# Patient Record
Sex: Male | Born: 1975 | Race: White | Hispanic: No | Marital: Single | Smoking: Former smoker
Health system: Southern US, Community
[De-identification: ages and names within clinical notes are randomized; demographics above are authoritative.]

## PROBLEM LIST (undated history)

## (undated) DIAGNOSIS — E119 Type 2 diabetes mellitus without complications: Secondary | ICD-10-CM

## (undated) DIAGNOSIS — F32A Depression, unspecified: Secondary | ICD-10-CM

## (undated) DIAGNOSIS — F329 Major depressive disorder, single episode, unspecified: Secondary | ICD-10-CM

## (undated) DIAGNOSIS — I10 Essential (primary) hypertension: Secondary | ICD-10-CM

## (undated) DIAGNOSIS — G4730 Sleep apnea, unspecified: Secondary | ICD-10-CM

## (undated) DIAGNOSIS — F419 Anxiety disorder, unspecified: Secondary | ICD-10-CM

## (undated) HISTORY — DX: Major depressive disorder, single episode, unspecified: F32.9

## (undated) HISTORY — DX: Depression, unspecified: F32.A

## (undated) HISTORY — DX: Type 2 diabetes mellitus without complications: E11.9

## (undated) HISTORY — DX: Anxiety disorder, unspecified: F41.9

## (undated) HISTORY — PX: APPENDECTOMY: SHX54

## (undated) HISTORY — PX: HERNIA REPAIR: SHX51

## (undated) HISTORY — PX: BACK SURGERY: SHX140

---

## 2014-12-02 ENCOUNTER — Ambulatory Visit (INDEPENDENT_AMBULATORY_CARE_PROVIDER_SITE_OTHER): Payer: Worker's Compensation | Admitting: Family Medicine

## 2014-12-02 VITALS — BP 132/66 | HR 79 | Temp 98.1°F | Resp 18 | Ht 72.0 in | Wt 357.0 lb

## 2014-12-02 DIAGNOSIS — M545 Low back pain: Secondary | ICD-10-CM

## 2014-12-20 NOTE — Progress Notes (Signed)
Subjective:  This chart was scribe for Norberto Sorenson, MD by Angelene Giovanni, ED Scribe. The patient was seen in Room 10 and the patient's care was started at 3:07 PM.    Patient ID: Jason Polite., male    DOB: 01-12-1976, 39 y.o.   MRN: [id number] Chief Complaint  Patient presents with  . Follow-up    back injury    HPI HPI Comments: Jason Levy. is a 39 y.o. male with hx of DM who presents to the [facility] for a follow up from his workers comp back injury. He reports that the pain is worse on the right that radiates mid, up. He reports that his toe numbness is improving. He states that PT is going well but he has goals that are not reached so he is here for an extension. He reports that he is still taking 2-3 Robaxin daily at night. He states that he has one refill left. He reports that he started a new job where he is rarely lifting and driving but overall less demanding than his previous job.   Lumbar spine xray normal Pt was last seen 2/22 when he had just started PT. He continued to healed well and symptoms have regressed since that time. Initially, he was put on Norco, Robaxin and Naprison. He currently uses Robaxin and Nabumetone prescribed by his PCP.   Past Medical History  Diagnosis Date  . Diabetes mellitus without complication   . Depression   . Anxiety     No current outpatient prescriptions on file prior to visit.   No current facility-administered medications on file prior to visit.   Allergies  Allergen Reactions  . Amoxil [Amoxicillin]   . Penicillins       Review of Systems  Constitutional: Negative for fever, chills, diaphoresis, activity change, appetite change, fatigue and unexpected weight change.  Respiratory: Negative for cough, chest tightness and shortness of breath.   Cardiovascular: Negative for chest pain.  Gastrointestinal: Negative for vomiting, abdominal pain and diarrhea.  Musculoskeletal: Positive for back pain.  Negative for joint swelling, gait problem, neck pain and neck stiffness.  Neurological: Negative for dizziness, light-headedness and headaches.       Objective:   Physical Exam  Constitutional: He is oriented to person, place, and time. He appears well-developed and well-nourished. No distress.  HENT:  Head: Normocephalic and atraumatic.  Eyes: Conjunctivae and EOM are normal.  Neck: Neck supple. No tracheal deviation present.  Cardiovascular: Normal rate.   Pulmonary/Chest: Effort normal. No respiratory distress.  Musculoskeletal: Normal range of motion.  No Tenderness over lumbar spine and left paraspinal but mild over right lumbar paraspinal.  2+ patella DTR Negative straight leg raise Calf muscles are nice and strong Flexion, extension mild limited Normal lateral flexion.   Neurological: He is alert and oriented to person, place, and time.  Skin: Skin is warm and dry.  Psychiatric: He has a normal mood and affect. His behavior is normal.  Nursing note and vitals reviewed.   BP 132/66 mmHg  Pulse 79  Temp(Src) 98.1 F (36.7 C) (Oral)  Resp 18  Ht 6' (1.829 m)  Wt 357 lb (161.934 kg)  BMI 48.41 kg/m2  SpO2 97%       Assessment & Plan:   Low back pain without sciatica, unspecified back pain laterality  Gradually impoving but recommend extending PT sessions, prn nsaids, light duty - recheck in 2-4 wks.   Meds ordered this encounter  Medications  . DISCONTD: HYDROcodone-acetaminophen (NORCO/VICODIN) 5-325 MG per tablet    Sig: Take 1 tablet by mouth every 6 (six) hours as needed for moderate pain.  . BuPROPion HCl (WELLBUTRIN PO)    Sig: Take by mouth 2 (two) times daily.  . Levothyroxine Sodium (SYNTHROID PO)    Sig: Take by mouth.  . LISINOPRIL PO    Sig: Take by [place].  . METFORMIN HCL PO    Sig: Take by mouth 2 (two) times daily.  [place]. GLIMEPIRIDE PO    Sig: Take by mouth.  . methocarbamol (ROBAXIN) 500 MG tablet    Sig: Take 500 mg by mouth 2 (two) times  daily.    I personally performed the services described in this documentation, which was scribed in my presence. The recorded information has been reviewed and considered, and addended by me as needed.  Norberto SorensonEva Kamsiyochukwu Buist, MD MPH

## 2014-12-22 ENCOUNTER — Telehealth: Payer: Self-pay

## 2014-12-22 DIAGNOSIS — M545 Low back pain: Secondary | ICD-10-CM

## 2014-12-22 NOTE — Telephone Encounter (Signed)
PT STATES HIS LAST VISIT ON 12/02/14 HE WAS TO GET MORE P.T. VISITS FOR HIS WORKERS COMP INJURY.. THERE IS NOT AN ORDER IN SYSTEM. SHOULD THIS PATIENT HAVE AN ORDER FOR ADDITIONAL P.T. VISITS?

## 2014-12-22 NOTE — Telephone Encounter (Signed)
Yes, he was already in PT and he needs to continue it at the same location through workers comp until they feel he has achieved maximal improvement thanks

## 2014-12-22 NOTE — Telephone Encounter (Signed)
Please advise on PT visits.

## 2014-12-24 NOTE — Telephone Encounter (Signed)
[facility] [place]. Referral sent. Pt notified.

## 2015-03-02 ENCOUNTER — Ambulatory Visit (INDEPENDENT_AMBULATORY_CARE_PROVIDER_SITE_OTHER): Payer: Worker's Compensation | Admitting: Family Medicine

## 2015-03-02 VITALS — BP 114/78 | HR 82 | Temp 98.4°F | Resp 16 | Ht 72.0 in | Wt 337.6 lb

## 2015-03-02 DIAGNOSIS — M5442 Lumbago with sciatica, left side: Secondary | ICD-10-CM | POA: Diagnosis not present

## 2015-03-02 DIAGNOSIS — R208 Other disturbances of skin sensation: Secondary | ICD-10-CM | POA: Diagnosis not present

## 2015-03-02 DIAGNOSIS — R200 Anesthesia of skin: Secondary | ICD-10-CM

## 2015-03-02 NOTE — Patient Instructions (Addendum)
Referral is being made to a neurologist for evaluation of numbness of the fourth and fifth toes under Worker's Compensation  No return here as necessary as there is nothing more that I can offer for this, and less the neurologist or Worker's Comp. requires you to return here for another visit.  Allowed to return to work with no restrictions
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
## 2016-08-25 DIAGNOSIS — G834 Cauda equina syndrome: Secondary | ICD-10-CM | POA: Insufficient documentation

## 2019-10-17 DIAGNOSIS — F17210 Nicotine dependence, cigarettes, uncomplicated: Secondary | ICD-10-CM | POA: Insufficient documentation

## 2019-10-17 DIAGNOSIS — Z794 Long term (current) use of insulin: Secondary | ICD-10-CM | POA: Insufficient documentation

## 2019-10-17 DIAGNOSIS — Z888 Allergy status to other drugs, medicaments and biological substances status: Secondary | ICD-10-CM | POA: Diagnosis not present

## 2019-10-17 DIAGNOSIS — E119 Type 2 diabetes mellitus without complications: Secondary | ICD-10-CM | POA: Diagnosis not present

## 2019-10-17 DIAGNOSIS — Z881 Allergy status to other antibiotic agents status: Secondary | ICD-10-CM | POA: Diagnosis not present

## 2019-10-17 DIAGNOSIS — F12188 Cannabis abuse with other cannabis-induced disorder: Secondary | ICD-10-CM | POA: Insufficient documentation

## 2019-10-17 DIAGNOSIS — Z880 Allergy status to penicillin: Secondary | ICD-10-CM | POA: Insufficient documentation

## 2019-10-17 DIAGNOSIS — N3000 Acute cystitis without hematuria: Secondary | ICD-10-CM | POA: Insufficient documentation

## 2019-10-17 DIAGNOSIS — Z79899 Other long term (current) drug therapy: Secondary | ICD-10-CM | POA: Insufficient documentation

## 2019-10-17 DIAGNOSIS — R1110 Vomiting, unspecified: Secondary | ICD-10-CM | POA: Diagnosis present

## 2019-10-17 NOTE — ED Triage Notes (Signed)
Pt reports nausea and vomiting for two days. Having the same issues since November. States burps that smell like sulfer.

## 2019-10-18 ENCOUNTER — Emergency Department (HOSPITAL_BASED_OUTPATIENT_CLINIC_OR_DEPARTMENT_OTHER)
Admission: EM | Admit: 2019-10-18 | Discharge: 2019-10-18 | Disposition: A | Discharge status: Home / Self Care | Payer: 59 | Attending: Emergency Medicine | Admitting: Emergency Medicine

## 2019-10-18 ENCOUNTER — Emergency Department (HOSPITAL_BASED_OUTPATIENT_CLINIC_OR_DEPARTMENT_OTHER): Payer: 59

## 2019-10-18 ENCOUNTER — Other Ambulatory Visit: Payer: Self-pay

## 2019-10-18 ENCOUNTER — Encounter (HOSPITAL_BASED_OUTPATIENT_CLINIC_OR_DEPARTMENT_OTHER): Payer: Self-pay | Admitting: Emergency Medicine

## 2019-10-18 DIAGNOSIS — F12188 Cannabis abuse with other cannabis-induced disorder: Secondary | ICD-10-CM

## 2019-10-18 DIAGNOSIS — N3000 Acute cystitis without hematuria: Secondary | ICD-10-CM

## 2019-10-18 LAB — COMPREHENSIVE METABOLIC PANEL
ALT: 34 U/L (ref 0–44)
AST: 30 U/L (ref 15–41)
Albumin: 4.6 g/dL (ref 3.5–5.0)
Alkaline Phosphatase: 62 U/L (ref 38–126)
Anion gap: 18 — ABNORMAL HIGH (ref 5–15)
BUN: 21 mg/dL — ABNORMAL HIGH (ref 6–20)
CO2: 18 mmol/L — ABNORMAL LOW (ref 22–32)
Calcium: 9.7 mg/dL (ref 8.9–10.3)
Chloride: 99 mmol/L (ref 98–111)
Creatinine, Ser: 1.17 mg/dL (ref 0.61–1.24)
GFR calc Af Amer: >60 mL/min (ref >60)
GFR calc non Af Amer: >60 mL/min (ref >60)
Glucose, Bld: 183 mg/dL — ABNORMAL HIGH (ref 70–99)
Potassium: 3.7 mmol/L (ref 3.5–5.1)
Sodium: 135 mmol/L (ref 135–145)
Total Bilirubin: 0.8 mg/dL (ref 0.3–1.2)
Total Protein: 8.1 g/dL (ref 6.5–8.1)

## 2019-10-18 LAB — URINALYSIS, MICROSCOPIC (REFLEX)

## 2019-10-18 LAB — CBC WITH DIFFERENTIAL/PLATELET
Abs Immature Granulocytes: 0.17 10*3/uL — ABNORMAL HIGH (ref 0.00–0.07)
Basophils Absolute: 0.1 10*3/uL (ref 0.0–0.1)
Basophils Relative: 1 %
Eosinophils Absolute: 0.5 10*3/uL (ref 0.0–0.5)
Eosinophils Relative: 2 %
HCT: 50.1 % (ref 39.0–52.0)
Hemoglobin: 16.1 g/dL (ref 13.0–17.0)
Immature Granulocytes: 1 %
Lymphocytes Relative: 20 %
Lymphs Abs: 4.5 10*3/uL — ABNORMAL HIGH (ref 0.7–4.0)
MCH: 26.4 pg (ref 26.0–34.0)
MCHC: 32.1 g/dL (ref 30.0–36.0)
MCV: 82 fL (ref 80.0–100.0)
Monocytes Absolute: 1.4 10*3/uL — ABNORMAL HIGH (ref 0.1–1.0)
Monocytes Relative: 6 %
Neutro Abs: 15.4 10*3/uL — ABNORMAL HIGH (ref 1.7–7.7)
Neutrophils Relative %: 70 %
Platelets: 501 10*3/uL — ABNORMAL HIGH (ref 150–400)
RBC: 6.11 MIL/uL — ABNORMAL HIGH (ref 4.22–5.81)
RDW: 14.5 % (ref 11.5–15.5)
WBC: 22 10*3/uL — ABNORMAL HIGH (ref 4.0–10.5)
nRBC: 0 % (ref 0.0–0.2)

## 2019-10-18 LAB — BASIC METABOLIC PANEL
Anion gap: 9 (ref 5–15)
BUN: 20 mg/dL (ref 6–20)
CO2: 24 mmol/L (ref 22–32)
Calcium: 9.1 mg/dL (ref 8.9–10.3)
Chloride: 101 mmol/L (ref 98–111)
Creatinine, Ser: 0.86 mg/dL (ref 0.61–1.24)
GFR calc Af Amer: >60 mL/min (ref >60)
GFR calc non Af Amer: >60 mL/min (ref >60)
Glucose, Bld: 137 mg/dL — ABNORMAL HIGH (ref 70–99)
Potassium: 3.8 mmol/L (ref 3.5–5.1)
Sodium: 134 mmol/L — ABNORMAL LOW (ref 135–145)

## 2019-10-18 LAB — RAPID URINE DRUG SCREEN, HOSP PERFORMED
Amphetamines: NOT DETECTED
Barbiturates: NOT DETECTED
Benzodiazepines: NOT DETECTED
Cocaine: NOT DETECTED
Opiates: NOT DETECTED
Tetrahydrocannabinol: POSITIVE — AB

## 2019-10-18 LAB — URINALYSIS, ROUTINE W REFLEX MICROSCOPIC
Glucose, UA: NEGATIVE mg/dL
Hgb urine dipstick: NEGATIVE
Ketones, ur: 15 mg/dL — AB
Leukocytes,Ua: NEGATIVE
Nitrite: NEGATIVE
Protein, ur: 100 mg/dL — AB
Specific Gravity, Urine: >1.03 — ABNORMAL HIGH (ref 1.005–1.030)
pH: 5.5 (ref 5.0–8.0)

## 2019-10-18 LAB — TROPONIN I (HIGH SENSITIVITY): Troponin I (High Sensitivity): 2 ng/L (ref <18)

## 2019-10-18 MED ORDER — HALOPERIDOL LACTATE 5 MG/ML IJ SOLN
5.0000 mg | Freq: Once | INTRAMUSCULAR | Status: AC
  Administered 2019-10-18: 5 mg via INTRAVENOUS
  Filled 2019-10-18: qty 1

## 2019-10-18 MED ORDER — SULFAMETHOXAZOLE-TRIMETHOPRIM 800-160 MG PO TABS: 1.0000 | ORAL_TABLET | Freq: Two times a day (BID) | ORAL | 0 refills | Status: AC

## 2019-10-18 MED ORDER — ONDANSETRON 8 MG PO TBDP: ORAL_TABLET | ORAL | 0 refills | Status: DC

## 2019-10-18 MED ORDER — IOHEXOL 300 MG/ML  SOLN
100.0000 mL | Freq: Once | INTRAMUSCULAR | Status: AC | PRN
  Administered 2019-10-18: 100 mL via INTRAVENOUS

## 2019-10-18 MED ORDER — ALUM & MAG HYDROXIDE-SIMETH 200-200-20 MG/5ML PO SUSP
30.0000 mL | Freq: Once | ORAL | Status: AC
  Administered 2019-10-18: 03:00:00 30 mL via ORAL
  Filled 2019-10-18: qty 30

## 2019-10-18 MED ORDER — SODIUM CHLORIDE 0.9 % IV BOLUS
1000.0000 mL | Freq: Once | INTRAVENOUS | Status: AC
  Administered 2019-10-18: 1000 mL via INTRAVENOUS

## 2019-10-18 NOTE — ED Provider Notes (Signed)
[facility] EMERGENCY DEPARTMENT Provider Note   CSN: [id number] Arrival date & time: 10/17/19  2348     History Chief Complaint  Patient presents with  . Emesis    Jason Varma. is a 44 y.o. male.  The history is provided by the patient.  Emesis Severity:  Severe Duration:  2 days Timing:  Intermittent Quality:  Stomach contents Progression:  Unchanged Chronicity:  Recurrent Recent urination:  Normal Context: not post-tussive   Relieved by:  Nothing Worsened by:  Nothing Ineffective treatments:  None tried Associated symptoms: no abdominal pain, no arthralgias, no chills, no cough, no diarrhea, no fever, no headaches, no myalgias, no sore throat and no URI   Risk factors: diabetes   Risk factors: no alcohol use   Patient with diabetes presents with 2 days of emesis without associated symptoms.  No covid symptoms no contacts.  No f/c/r.  No CP no abdominal pain.       Past Medical History:  Diagnosis Date  . Anxiety   . Depression   . Diabetes mellitus without complication ([place])     There are no problems to display for this patient.   Past Surgical History:  Procedure Laterality Date  . APPENDECTOMY    . BACK SURGERY    . HERNIA REPAIR         History reviewed. No pertinent family history.  Social History   Tobacco Use  . Smoking status: Current Every Day Smoker  . Smokeless tobacco: Never Used  Substance Use Topics  . Alcohol use: Not Currently    Alcohol/week: 0.0 standard drinks  . Drug use: Yes    Types: Marijuana    Home Medications Prior to Admission medications   Medication Sig Start Date End Date Taking? Authorizing Provider  Insulin Glargine, 1 Unit Dial, (TOUJEO SOLOSTAR) 300 UNIT/ML SOPN INJECT 25 UNITS INTO THE SKIN NIGHTLY. **PLEASE DISREGARD PRIOR RX FOR LANTUS SENT IN** 06/26/19  Yes [provider]  Semaglutide, 1 MG/DOSE, (OZEMPIC, 1 MG/DOSE,) 2 MG/1.5ML SOPN Inject into the skin. 09/22/19  Yes [provider]  BuPROPion HCl (WELLBUTRIN PO) Take 150 mg by mouth 3 (three) times daily.     [provider]  DULoxetine (CYMBALTA) 60 MG capsule Take 60 mg by mouth daily. 07/18/19   [provider]  GLIMEPIRIDE PO Take 2 mg by mouth 2 (two) times daily.     [provider]  HYDROcodone-acetaminophen (NORCO) 10-325 MG tablet Take 1 tablet by mouth every 4 (four) hours as needed. 10/02/19   [provider]  Levothyroxine Sodium (SYNTHROID PO) Take by mouth.    [provider]  LISINOPRIL PO Take 20 mg by mouth daily.     [provider]  METFORMIN HCL PO Take 500 mg by mouth 2 (two) times daily.     [provider]  methocarbamol (ROBAXIN) 500 MG tablet Take 500 mg by mouth 2 (two) times daily.    [provider]  pantoprazole (PROTONIX) 40 MG tablet Take 40 mg by mouth 2 (two) times daily. 08/16/19   [provider]  promethazine (PHENERGAN) 25 MG tablet Take 25 mg by mouth 3 (three) times daily as needed. 07/31/19   [provider]  rosuvastatin (CRESTOR) 5 MG tablet Take 5 mg by mouth daily. 08/22/19   [provider]    Allergies    Cefdinir, Nabumetone, Citalopram, Amoxil [amoxicillin], and Penicillins  Review of Systems   Review of Systems  Constitutional:  Negative for chills and fever.  HENT: Negative for sore throat.   Eyes: Negative for visual disturbance.  Respiratory: Negative for cough.   Cardiovascular: Negative for chest pain.  Gastrointestinal: Positive for vomiting. Negative for abdominal pain and diarrhea.  Genitourinary: Negative for difficulty urinating and dysuria.  Musculoskeletal: Negative for arthralgias and myalgias.  Neurological: Negative for headaches.  Psychiatric/Behavioral: Negative for agitation.  All other systems reviewed and are negative.   Physical Exam Updated Vital Signs BP (!) 141/74 (BP Location: Right Arm)   Pulse (!) 110   Temp 98.5 F (36.9 C)  (Oral)   Resp (!) 25   Ht 5\' 11"  (1.803 m)   Wt 133.4 kg   SpO2 95%   BMI 41.00 kg/m   Physical Exam Vitals and nursing note reviewed.  Constitutional:      General: He is not in acute distress.    Appearance: Normal appearance.  HENT:     Head: Normocephalic and atraumatic.     Nose: Nose normal.  Eyes:     Conjunctiva/sclera: Conjunctivae normal.     Pupils: Pupils are equal, round, and reactive to light.  Cardiovascular:     Rate and Rhythm: Normal rate and regular rhythm.     Pulses: Normal pulses.     Heart sounds: Normal heart sounds.  Pulmonary:     Effort: Pulmonary effort is normal.     Breath sounds: Normal breath sounds.  Abdominal:     General: Abdomen is flat. Bowel sounds are normal.     Palpations: Abdomen is soft.     Tenderness: There is no abdominal tenderness. There is no guarding or rebound.  Musculoskeletal:        General: Normal range of motion.     Cervical back: Normal range of motion and neck supple.  Skin:    General: Skin is warm and dry.     Capillary Refill: Capillary refill takes less than 2 seconds.  Neurological:     General: No focal deficit present.     Mental Status: He is alert and oriented to person, place, and time.     Deep Tendon Reflexes: Reflexes normal.  Psychiatric:        Mood and Affect: Mood normal.        Behavior: Behavior normal.     ED Results / Procedures / Treatments   Labs (all labs ordered are listed, but only abnormal results are displayed) Labs Reviewed  CBC WITH DIFFERENTIAL/PLATELET - Abnormal; Notable for the following components:      Result Value   WBC 22.0 (*)    RBC 6.11 (*)    Platelets 501 (*)    Neutro Abs 15.4 (*)    Lymphs Abs 4.5 (*)    Monocytes Absolute 1.4 (*)    Abs Immature Granulocytes 0.17 (*)    All other components within normal limits  COMPREHENSIVE METABOLIC PANEL - Abnormal; Notable for the following components:   CO2 18 (*)    Glucose, Bld 183 (*)    BUN 21 (*)    Anion  gap 18 (*)    All other components within normal limits  URINALYSIS, ROUTINE W REFLEX MICROSCOPIC - Abnormal; Notable for the following components:   APPearance HAZY (*)    Specific Gravity, Urine >1.030 (*)    Bilirubin Urine MODERATE (*)    Ketones, ur 15 (*)    Protein, ur 100 (*)    All other components within normal limits  RAPID URINE DRUG  SCREEN, HOSP PERFORMED - Abnormal; Notable for the following components:   Tetrahydrocannabinol POSITIVE (*)    All other components within normal limits  URINALYSIS, MICROSCOPIC (REFLEX) - Abnormal; Notable for the following components:   Bacteria, UA MANY (*)    All other components within normal limits  BASIC METABOLIC PANEL - Abnormal; Notable for the following components:   Sodium 134 (*)    Glucose, Bld 137 (*)    All other components within normal limits  TROPONIN I (HIGH SENSITIVITY)    EKG None  Radiology CT ABDOMEN PELVIS W CONTRAST  Result Date: 10/18/2019 CLINICAL DATA:  Nausea and vomiting for 2 days EXAM: CT ABDOMEN AND PELVIS WITH CONTRAST TECHNIQUE: Multidetector CT imaging of the abdomen and pelvis was performed using the standard protocol following bolus administration of intravenous contrast. CONTRAST:  OMNIPAQUE IOHEXOL 300 MG/ML  SOLN COMPARISON:  None. FINDINGS: Lower chest: No acute abnormality. Hepatobiliary: Fatty infiltration of the liver is noted. The gallbladder is within normal limits. Pancreas: Unremarkable. No pancreatic ductal dilatation or surrounding inflammatory changes. Spleen: Normal in size without focal abnormality. Adrenals/Urinary Tract: Adrenal glands are within normal limits. Kidneys demonstrate a normal enhancement pattern bilaterally. No renal calculi or obstructive changes are seen. Normal excretion of contrast is noted. Bladder is partially distended. Stomach/Bowel: Colon is well visualized and predominately decompressed. No obstructive or inflammatory changes are seen. The appendix has been  surgically removed. No small bowel or gastric abnormality is seen. Vascular/Lymphatic: No significant vascular findings are present. No enlarged abdominal or pelvic lymph nodes. Reproductive: Prostate is unremarkable. Other: No abdominal wall hernia or abnormality. No abdominopelvic ascites. Musculoskeletal: Spinal stimulator is noted in the lower thoracic spine. No acute bony abnormality is noted. IMPRESSION: Chronic changes without acute abnormality. Electronically Signed   By: Alcide Clever M.D.   On: 10/18/2019 02:02    Procedures Procedures (including critical care time)  Medications Ordered in ED Medications  haloperidol lactate (HALDOL) injection 5 mg (5 mg Intravenous Given 10/18/19 0031)  sodium chloride 0.9 % bolus 1,000 mL (0 mLs Intravenous Stopped 10/18/19 0232)  iohexol (OMNIPAQUE) 300 MG/ML solution 100 mL (100 mLs Intravenous Contrast Given 10/18/19 0146)  alum & mag hydroxide-simeth (MAALOX/MYLANTA) 200-200-20 MG/5ML suspension 30 mL (30 mLs Oral Given 10/18/19 0231)    ED Course  I have reviewed the triage vital signs and the nursing notes.  Pertinent labs & imaging results that were available during my care of the patient were reviewed by me and considered in my medical decision making (see chart for details).  The white count is likely demargination from ongoing vomiting.  I do not believe the patient is septic.  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  He immediately stopped vomiting post haldol.  Vitals markedly improved.  I gave fluids and rechecked labs to ensure anion gap was normal and it was and the patient PO challenged successfully.  Stable for discharge with close follow up.    Jason Polite. was evaluated in Emergency Department on 10/18/2019 for the symptoms described in the history of present illness. He was evaluated in the  context of the global COVID-19 pandemic, which necessitated consideration that the patient might be at risk for infection with the SARS-CoV-2 virus that causes COVID-19. Institutional protocols and algorithms that pertain to the evaluation of patients at risk for COVID-19 are in a state  of rapid change based on information released by regulatory bodies including the CDC and federal and state organizations. These policies and algorithms were followed during the patient's care in the ED.  Final Clinical Impression(s) / ED Diagnoses Return for weakness, numbness, changes in vision or speech, fevers >100.4 unrelieved by medication, shortness of breath, intractable vomiting, or diarrhea, abdominal pain, Inability to tolerate liquids or food, cough, altered mental status or any concerns. No signs of systemic illness or infection. The patient is nontoxic-appearing on exam and vital signs are within normal limits.   I have reviewed the triage vital signs and the nursing notes. Pertinent labs &imaging results that were available during my care of the patient were reviewed by me and considered in my medical decision making (see chart for details).  After history, exam, and medical workup I feel the patient has been appropriately medically screened and is safe for discharge home. Pertinent diagnoses were discussed with the patient. Patient was given return precautions   Anamaria Dusenbury, MD 10/18/19 [phone]

## 2019-10-18 NOTE — ED Notes (Signed)
PO challenge passed 

## 2020-03-23 ENCOUNTER — Other Ambulatory Visit: Payer: Self-pay | Admitting: Orthopaedic Surgery

## 2020-03-23 ENCOUNTER — Telehealth: Payer: Self-pay

## 2020-03-23 DIAGNOSIS — M961 Postlaminectomy syndrome, not elsewhere classified: Secondary | ICD-10-CM

## 2020-03-23 NOTE — Telephone Encounter (Signed)
Spoke with patient to review his medications and drug allergies before he is scheduled for a lumbar myelogram.  He stated an understanding that he will be here two hours, needs a driver and will need to be on strict bedrest (explained) for 24 hours after the procedure.  He also states an understanding to hold Bupropion, Cymbalta and Trazodone for 48 hours before, and 24 hours after, the myelogram

## 2020-03-30 ENCOUNTER — Ambulatory Visit
Admission: RE | Admit: 2020-03-30 | Discharge: 2020-03-30 | Disposition: A | Discharge status: Home / Self Care | Payer: 59 | Source: Ambulatory Visit | Attending: Orthopaedic Surgery | Admitting: Orthopaedic Surgery

## 2020-03-30 VITALS — BP 153/85 | HR 60

## 2020-03-30 DIAGNOSIS — G834 Cauda equina syndrome: Secondary | ICD-10-CM

## 2020-03-30 DIAGNOSIS — M961 Postlaminectomy syndrome, not elsewhere classified: Secondary | ICD-10-CM

## 2020-03-30 MED ORDER — IOPAMIDOL (ISOVUE-M 200) INJECTION 41%
15.0000 mL | Freq: Once | INTRAMUSCULAR | Status: AC
  Administered 2020-03-30: 15 mL via INTRATHECAL

## 2020-03-30 MED ORDER — DIAZEPAM 5 MG PO TABS
10.0000 mg | ORAL_TABLET | Freq: Once | ORAL | Status: AC
  Administered 2020-03-30: 10 mg via ORAL

## 2020-03-30 NOTE — Progress Notes (Signed)
Patient states he has been off Bupropion, Duloxetine and Trazodone for at least the past two days.

## 2020-03-30 NOTE — Discharge Instructions (Signed)
Myelogram Discharge Instructions  1. Go home and rest quietly for the next 24 hours.  It is important to lie flat for the next 24 hours.  Get up only to go to the restroom.  You may lie in the bed or on a couch on your back, your stomach, your left side or your right side.  You may have one pillow under your head.  You may have pillows between your knees while you are on your side or under your knees while you are on your back.  2. DO NOT drive today.  Recline the seat as far back as it will go, while still wearing your seat belt, on the way home.  3. You may get up to go to the bathroom as needed.  You may sit up for 10 minutes to eat.  You may resume your normal diet and medications unless otherwise indicated.  Drink plenty of extra fluids today and tomorrow.  4. The incidence of a spinal headache with nausea and/or vomiting is about 5% (one in 20 patients).  If you develop a headache, lie flat and drink plenty of fluids until the headache goes away.  Caffeinated beverages may be helpful.  If you develop severe nausea and vomiting or a headache that does not go away with flat bed rest, call [phone].  5. You may resume normal activities after your 24 hours of bed rest is over; however, do not exert yourself strongly or do any heavy lifting tomorrow.  6. Call your physician for a follow-up appointment.    You may resume Bupropion, Duloxetine and Trazodone on Tuesday, March 31, 2020 after 1:00p.m.

## 2020-06-29 ENCOUNTER — Emergency Department (HOSPITAL_COMMUNITY): Payer: Commercial Managed Care - PPO

## 2020-06-29 ENCOUNTER — Other Ambulatory Visit: Payer: Self-pay

## 2020-06-29 ENCOUNTER — Observation Stay (HOSPITAL_COMMUNITY)
Admission: EM | Admit: 2020-06-29 | Discharge: 2020-07-01 | Disposition: A | Discharge status: Home / Self Care | Payer: Commercial Managed Care - PPO | Attending: Internal Medicine | Admitting: Internal Medicine

## 2020-06-29 ENCOUNTER — Encounter (HOSPITAL_COMMUNITY): Payer: Self-pay | Admitting: Internal Medicine

## 2020-06-29 DIAGNOSIS — Z79899 Other long term (current) drug therapy: Secondary | ICD-10-CM | POA: Diagnosis not present

## 2020-06-29 DIAGNOSIS — Z794 Long term (current) use of insulin: Secondary | ICD-10-CM | POA: Insufficient documentation

## 2020-06-29 DIAGNOSIS — Z20822 Contact with and (suspected) exposure to covid-19: Secondary | ICD-10-CM | POA: Insufficient documentation

## 2020-06-29 DIAGNOSIS — S9304XA Dislocation of right ankle joint, initial encounter: Secondary | ICD-10-CM

## 2020-06-29 DIAGNOSIS — S82831A Other fracture of upper and lower end of right fibula, initial encounter for closed fracture: Secondary | ICD-10-CM | POA: Diagnosis not present

## 2020-06-29 DIAGNOSIS — F17200 Nicotine dependence, unspecified, uncomplicated: Secondary | ICD-10-CM | POA: Diagnosis not present

## 2020-06-29 DIAGNOSIS — S82899A Other fracture of unspecified lower leg, initial encounter for closed fracture: Secondary | ICD-10-CM | POA: Diagnosis present

## 2020-06-29 DIAGNOSIS — E119 Type 2 diabetes mellitus without complications: Secondary | ICD-10-CM | POA: Diagnosis not present

## 2020-06-29 DIAGNOSIS — Z7984 Long term (current) use of oral hypoglycemic drugs: Secondary | ICD-10-CM | POA: Diagnosis not present

## 2020-06-29 DIAGNOSIS — S82891A Other fracture of right lower leg, initial encounter for closed fracture: Secondary | ICD-10-CM | POA: Diagnosis present

## 2020-06-29 DIAGNOSIS — E1165 Type 2 diabetes mellitus with hyperglycemia: Secondary | ICD-10-CM | POA: Diagnosis present

## 2020-06-29 DIAGNOSIS — S99911A Unspecified injury of right ankle, initial encounter: Secondary | ICD-10-CM | POA: Diagnosis present

## 2020-06-29 DIAGNOSIS — T148XXA Other injury of unspecified body region, initial encounter: Secondary | ICD-10-CM

## 2020-06-29 DIAGNOSIS — I10 Essential (primary) hypertension: Secondary | ICD-10-CM | POA: Diagnosis present

## 2020-06-29 LAB — RESPIRATORY PANEL BY RT PCR (FLU A&B, COVID)
Influenza A by PCR: NEGATIVE
Influenza B by PCR: NEGATIVE
SARS Coronavirus 2 by RT PCR: NEGATIVE

## 2020-06-29 LAB — BASIC METABOLIC PANEL
Anion gap: 11 (ref 5–15)
BUN: 17 mg/dL (ref 6–20)
CO2: 23 mmol/L (ref 22–32)
Calcium: 8.4 mg/dL — ABNORMAL LOW (ref 8.9–10.3)
Chloride: 103 mmol/L (ref 98–111)
Creatinine, Ser: 0.81 mg/dL (ref 0.61–1.24)
GFR, Estimated: >60 mL/min (ref >60)
Glucose, Bld: 117 mg/dL — ABNORMAL HIGH (ref 70–99)
Potassium: 3.9 mmol/L (ref 3.5–5.1)
Sodium: 137 mmol/L (ref 135–145)

## 2020-06-29 LAB — CBC WITH DIFFERENTIAL/PLATELET
Abs Immature Granulocytes: 0.08 10*3/uL — ABNORMAL HIGH (ref 0.00–0.07)
Basophils Absolute: 0.1 10*3/uL (ref 0.0–0.1)
Basophils Relative: 0 %
Eosinophils Absolute: 0.1 10*3/uL (ref 0.0–0.5)
Eosinophils Relative: 1 %
HCT: 40.8 % (ref 39.0–52.0)
Hemoglobin: 13.4 g/dL (ref 13.0–17.0)
Immature Granulocytes: 1 %
Lymphocytes Relative: 14 %
Lymphs Abs: 2.4 10*3/uL (ref 0.7–4.0)
MCH: 27.2 pg (ref 26.0–34.0)
MCHC: 32.8 g/dL (ref 30.0–36.0)
MCV: 82.8 fL (ref 80.0–100.0)
Monocytes Absolute: 1.1 10*3/uL — ABNORMAL HIGH (ref 0.1–1.0)
Monocytes Relative: 7 %
Neutro Abs: 13 10*3/uL — ABNORMAL HIGH (ref 1.7–7.7)
Neutrophils Relative %: 77 %
Platelets: 228 10*3/uL (ref 150–400)
RBC: 4.93 MIL/uL (ref 4.22–5.81)
RDW: 13.2 % (ref 11.5–15.5)
WBC: 16.8 10*3/uL — ABNORMAL HIGH (ref 4.0–10.5)
nRBC: 0 % (ref 0.0–0.2)

## 2020-06-29 MED ORDER — DULOXETINE HCL 60 MG PO CPEP
60.0000 mg | ORAL_CAPSULE | Freq: Every day | ORAL | Status: DC
  Administered 2020-06-30 – 2020-07-01 (×2): 60 mg via ORAL
  Filled 2020-06-29 (×2): qty 1

## 2020-06-29 MED ORDER — OXYCODONE HCL 5 MG PO TABS
5.0000 mg | ORAL_TABLET | ORAL | Status: DC | PRN
  Administered 2020-06-30: 5 mg via ORAL
  Filled 2020-06-29: qty 1

## 2020-06-29 MED ORDER — INSULIN GLARGINE (1 UNIT DIAL) 300 UNIT/ML ~~LOC~~ SOPN: 30.0000 [IU] | PEN_INJECTOR | Freq: Every day | SUBCUTANEOUS | Status: DC

## 2020-06-29 MED ORDER — HYDROMORPHONE HCL 1 MG/ML IJ SOLN
1.0000 mg | Freq: Once | INTRAMUSCULAR | Status: AC
  Administered 2020-06-29: 1 mg via INTRAVENOUS
  Filled 2020-06-29: qty 1

## 2020-06-29 MED ORDER — METHOCARBAMOL 500 MG PO TABS
500.0000 mg | ORAL_TABLET | Freq: Once | ORAL | Status: AC
  Administered 2020-06-29: 500 mg via ORAL
  Filled 2020-06-29: qty 1

## 2020-06-29 MED ORDER — TRAZODONE HCL 100 MG PO TABS
100.0000 mg | ORAL_TABLET | Freq: Every day | ORAL | Status: DC
  Administered 2020-06-30 (×2): 100 mg via ORAL
  Filled 2020-06-29 (×2): qty 1

## 2020-06-29 MED ORDER — LISINOPRIL 20 MG PO TABS
20.0000 mg | ORAL_TABLET | Freq: Every day | ORAL | Status: DC
  Administered 2020-06-30 – 2020-07-01 (×2): 20 mg via ORAL
  Filled 2020-06-29 (×2): qty 1

## 2020-06-29 MED ORDER — GABAPENTIN 300 MG PO CAPS
300.0000 mg | ORAL_CAPSULE | Freq: Once | ORAL | Status: AC
  Administered 2020-06-29: 300 mg via ORAL
  Filled 2020-06-29: qty 1

## 2020-06-29 MED ORDER — METHOCARBAMOL 500 MG PO TABS: 500.0000 mg | ORAL_TABLET | Freq: Four times a day (QID) | ORAL | Status: DC | PRN

## 2020-06-29 MED ORDER — BUPROPION HCL ER (XL) 300 MG PO TB24
450.0000 mg | ORAL_TABLET | Freq: Every day | ORAL | Status: DC
  Administered 2020-06-30 – 2020-07-01 (×2): 450 mg via ORAL
  Filled 2020-06-29 (×2): qty 1

## 2020-06-29 MED ORDER — ENOXAPARIN SODIUM 40 MG/0.4ML ~~LOC~~ SOLN
40.0000 mg | SUBCUTANEOUS | Status: DC
  Administered 2020-07-01: 40 mg via SUBCUTANEOUS
  Filled 2020-06-29: qty 0.4

## 2020-06-29 MED ORDER — ACETAMINOPHEN 325 MG PO TABS
650.0000 mg | ORAL_TABLET | Freq: Four times a day (QID) | ORAL | Status: DC | PRN
  Administered 2020-06-30: 650 mg via ORAL
  Filled 2020-06-29: qty 2

## 2020-06-29 MED ORDER — INSULIN ASPART 100 UNIT/ML ~~LOC~~ SOLN
0.0000 [IU] | Freq: Three times a day (TID) | SUBCUTANEOUS | Status: DC
  Administered 2020-06-30: 5 [IU] via SUBCUTANEOUS
  Administered 2020-07-01: 2 [IU] via SUBCUTANEOUS
  Filled 2020-06-29: qty 0.09

## 2020-06-29 MED ORDER — ALBUTEROL SULFATE HFA 108 (90 BASE) MCG/ACT IN AERS: 2.0000 | INHALATION_SPRAY | Freq: Four times a day (QID) | RESPIRATORY_TRACT | Status: DC | PRN

## 2020-06-29 MED ORDER — ACETAMINOPHEN 650 MG RE SUPP: 650.0000 mg | Freq: Four times a day (QID) | RECTAL | Status: DC | PRN

## 2020-06-29 MED ORDER — SENNA 8.6 MG PO TABS
1.0000 | ORAL_TABLET | Freq: Two times a day (BID) | ORAL | Status: DC
  Administered 2020-06-30 – 2020-07-01 (×3): 8.6 mg via ORAL
  Filled 2020-06-29 (×3): qty 1

## 2020-06-29 MED ORDER — FERROUS SULFATE 325 (65 FE) MG PO TABS
325.0000 mg | ORAL_TABLET | Freq: Every day | ORAL | Status: DC
  Administered 2020-07-01: 325 mg via ORAL
  Filled 2020-06-29: qty 1

## 2020-06-29 MED ORDER — NICOTINE 14 MG/24HR TD PT24
14.0000 mg | MEDICATED_PATCH | Freq: Once | TRANSDERMAL | Status: AC
  Administered 2020-06-29 – 2020-06-30 (×2): 14 mg via TRANSDERMAL
  Filled 2020-06-29 (×2): qty 1

## 2020-06-29 MED ORDER — OXYCODONE-ACETAMINOPHEN 5-325 MG PO TABS
1.0000 | ORAL_TABLET | Freq: Once | ORAL | Status: AC
  Administered 2020-06-29: 1 via ORAL
  Filled 2020-06-29: qty 1

## 2020-06-29 MED ORDER — ROSUVASTATIN CALCIUM 5 MG PO TABS
5.0000 mg | ORAL_TABLET | Freq: Every day | ORAL | Status: DC
  Administered 2020-06-30 – 2020-07-01 (×2): 5 mg via ORAL
  Filled 2020-06-29 (×3): qty 1

## 2020-06-29 MED ORDER — ONDANSETRON HCL 4 MG/2ML IJ SOLN
4.0000 mg | Freq: Once | INTRAMUSCULAR | Status: AC
  Administered 2020-06-29: 4 mg via INTRAVENOUS
  Filled 2020-06-29: qty 2
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
## 2020-06-29 NOTE — Progress Notes (Signed)
Orthopedic Tech Progress Note Patient Details:  Jason Levy 12/16/1975 [id number] Er doctor applied post splint Ortho Devices Type of Ortho Device: Ace wrap, Stirrup splint Ortho Device/Splint Location: RLE Ortho Device/Splint Interventions: Ordered, Application   Post Interventions Patient Tolerated: Well Instructions Provided: Care of device   [name] 06/29/2020, 5:24 PM

## 2020-06-29 NOTE — ED Provider Notes (Signed)
Reduction of fracture/dislocation Date/Time: 6:36 PM Performed by: Wynetta Fines Authorized by: Wynetta Fines Consent: Verbal consent obtained. Risks and benefits: risks, benefits and alternatives were discussed Consent given by: patient Required items: required blood products, implants, devices, and special equipment available Time out: Immediately prior to procedure a "time out" was called to verify the correct patient, procedure, equipment, support staff and site/side marked as required.  Patient tolerance: Patient tolerated the procedure well with no immediate complications.  Joint: Right Ankle   Reduction technique: Traction/manipulation  Ankle Fracture/dislocation reduced easily.   Posterior ankle splint (orthoglass) applied by myself immediately after reduction.   Right distal LE NVI after reduction and splinting.        Wynetta Fines, MD 06/29/20 [place]
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
## 2020-06-30 ENCOUNTER — Encounter (HOSPITAL_COMMUNITY): Payer: Self-pay | Admitting: Internal Medicine

## 2020-06-30 ENCOUNTER — Encounter (HOSPITAL_COMMUNITY)
Admission: EM | Disposition: A | Discharge status: Home / Self Care | Payer: Self-pay | Source: Home / Self Care | Attending: Emergency Medicine

## 2020-06-30 ENCOUNTER — Observation Stay (HOSPITAL_COMMUNITY): Payer: Commercial Managed Care - PPO | Admitting: Anesthesiology

## 2020-06-30 DIAGNOSIS — S82891A Other fracture of right lower leg, initial encounter for closed fracture: Secondary | ICD-10-CM | POA: Diagnosis not present

## 2020-06-30 HISTORY — PX: ORIF ANKLE FRACTURE: SHX5408

## 2020-06-30 LAB — GLUCOSE, CAPILLARY
Glucose-Capillary: 152 mg/dL — ABNORMAL HIGH (ref 70–99)
Glucose-Capillary: 130 mg/dL — ABNORMAL HIGH (ref 70–99)
Glucose-Capillary: 142 mg/dL — ABNORMAL HIGH (ref 70–99)
Glucose-Capillary: 251 mg/dL — ABNORMAL HIGH (ref 70–99)
Glucose-Capillary: 230 mg/dL — ABNORMAL HIGH (ref 70–99)

## 2020-06-30 LAB — BASIC METABOLIC PANEL
Anion gap: 11 (ref 5–15)
BUN: 18 mg/dL (ref 6–20)
CO2: 23 mmol/L (ref 22–32)
Calcium: 8.8 mg/dL — ABNORMAL LOW (ref 8.9–10.3)
Chloride: 100 mmol/L (ref 98–111)
Creatinine, Ser: 0.88 mg/dL (ref 0.61–1.24)
GFR, Estimated: >60 mL/min (ref >60)
Glucose, Bld: 168 mg/dL — ABNORMAL HIGH (ref 70–99)
Potassium: 4.4 mmol/L (ref 3.5–5.1)
Sodium: 134 mmol/L — ABNORMAL LOW (ref 135–145)

## 2020-06-30 LAB — CBG MONITORING, ED: Glucose-Capillary: 139 mg/dL — ABNORMAL HIGH (ref 70–99)

## 2020-06-30 LAB — CBC
HCT: 41.5 % (ref 39.0–52.0)
Hemoglobin: 13.4 g/dL (ref 13.0–17.0)
MCH: 26.9 pg (ref 26.0–34.0)
MCHC: 32.3 g/dL (ref 30.0–36.0)
MCV: 83.2 fL (ref 80.0–100.0)
Platelets: 207 10*3/uL (ref 150–400)
RBC: 4.99 MIL/uL (ref 4.22–5.81)
RDW: 13.3 % (ref 11.5–15.5)
WBC: 12.4 10*3/uL — ABNORMAL HIGH (ref 4.0–10.5)
nRBC: 0 % (ref 0.0–0.2)

## 2020-06-30 LAB — SURGICAL PCR SCREEN
MRSA, PCR: NEGATIVE
Staphylococcus aureus: NEGATIVE

## 2020-06-30 LAB — HIV ANTIBODY (ROUTINE TESTING W REFLEX): HIV Screen 4th Generation wRfx: NONREACTIVE

## 2020-06-30 SURGERY — OPEN REDUCTION INTERNAL FIXATION (ORIF) ANKLE FRACTURE
Anesthesia: General | Site: Ankle | Laterality: Right

## 2020-06-30 MED ORDER — ASPIRIN EC 81 MG PO TBEC: 81.0000 mg | DELAYED_RELEASE_TABLET | Freq: Every day | ORAL | 0 refills | Status: AC

## 2020-06-30 MED ORDER — SENNOSIDES-DOCUSATE SODIUM 8.6-50 MG PO TABS: 1.0000 | ORAL_TABLET | Freq: Every evening | ORAL | Status: DC | PRN

## 2020-06-30 MED ORDER — BUPIVACAINE-EPINEPHRINE (PF) 0.5% -1:200000 IJ SOLN
INTRAMUSCULAR | Status: DC | PRN
  Administered 2020-06-30: 30 mL via PERINEURAL

## 2020-06-30 MED ORDER — PHENYLEPHRINE 40 MCG/ML (10ML) SYRINGE FOR IV PUSH (FOR BLOOD PRESSURE SUPPORT)
PREFILLED_SYRINGE | INTRAVENOUS | Status: DC | PRN
  Administered 2020-06-30: 80 ug via INTRAVENOUS
  Administered 2020-06-30: 120 ug via INTRAVENOUS
  Administered 2020-06-30 (×2): 80 ug via INTRAVENOUS

## 2020-06-30 MED ORDER — ONDANSETRON HCL 4 MG PO TABS: 4.0000 mg | ORAL_TABLET | Freq: Four times a day (QID) | ORAL | Status: DC | PRN

## 2020-06-30 MED ORDER — ONDANSETRON HCL 4 MG/2ML IJ SOLN
INTRAMUSCULAR | Status: AC
  Filled 2020-06-30: qty 2

## 2020-06-30 MED ORDER — ACETAMINOPHEN 325 MG PO TABS: 325.0000 mg | ORAL_TABLET | Freq: Four times a day (QID) | ORAL | Status: DC | PRN

## 2020-06-30 MED ORDER — ONDANSETRON HCL 4 MG/2ML IJ SOLN
INTRAMUSCULAR | Status: DC | PRN
  Administered 2020-06-30: 4 mg via INTRAVENOUS

## 2020-06-30 MED ORDER — HYDROMORPHONE HCL 1 MG/ML IJ SOLN: 0.5000 mg | INTRAMUSCULAR | Status: DC | PRN

## 2020-06-30 MED ORDER — DEXAMETHASONE SODIUM PHOSPHATE 10 MG/ML IJ SOLN
INTRAMUSCULAR | Status: DC | PRN
  Administered 2020-06-30: 10 mg via INTRAVENOUS

## 2020-06-30 MED ORDER — PROPOFOL 10 MG/ML IV BOLUS
INTRAVENOUS | Status: AC
  Filled 2020-06-30: qty 20

## 2020-06-30 MED ORDER — CHLORHEXIDINE GLUCONATE 4 % EX LIQD: 60.0000 mL | Freq: Once | CUTANEOUS | Status: DC

## 2020-06-30 MED ORDER — METOCLOPRAMIDE HCL 5 MG PO TABS: 5.0000 mg | ORAL_TABLET | Freq: Three times a day (TID) | ORAL | Status: DC | PRN

## 2020-06-30 MED ORDER — EPHEDRINE SULFATE-NACL 50-0.9 MG/10ML-% IV SOSY
PREFILLED_SYRINGE | INTRAVENOUS | Status: DC | PRN
  Administered 2020-06-30: 5 mg via INTRAVENOUS
  Administered 2020-06-30: 10 mg via INTRAVENOUS
  Administered 2020-06-30 (×2): 5 mg via INTRAVENOUS

## 2020-06-30 MED ORDER — OXYCODONE-ACETAMINOPHEN 5-325 MG PO TABS
1.0000 | ORAL_TABLET | Freq: Four times a day (QID) | ORAL | 0 refills | Status: AC | PRN
Start: 2020-06-30 — End: 2020-07-05

## 2020-06-30 MED ORDER — ACETAMINOPHEN 500 MG PO TABS
1000.0000 mg | ORAL_TABLET | Freq: Four times a day (QID) | ORAL | Status: DC
  Administered 2020-06-30 – 2020-07-01 (×3): 1000 mg via ORAL
  Filled 2020-06-30 (×3): qty 2

## 2020-06-30 MED ORDER — MIDAZOLAM HCL 2 MG/2ML IJ SOLN
INTRAMUSCULAR | Status: AC
  Filled 2020-06-30: qty 2

## 2020-06-30 MED ORDER — HYDROMORPHONE HCL 1 MG/ML IJ SOLN
0.5000 mg | INTRAMUSCULAR | Status: DC | PRN
  Administered 2020-06-30: 0.5 mg via INTRAVENOUS
  Filled 2020-06-30: qty 1

## 2020-06-30 MED ORDER — MIDAZOLAM HCL 2 MG/2ML IJ SOLN
1.0000 mg | INTRAMUSCULAR | Status: AC
  Administered 2020-06-30: 2 mg via INTRAVENOUS

## 2020-06-30 MED ORDER — CEFAZOLIN SODIUM-DEXTROSE 2-4 GM/100ML-% IV SOLN
2.0000 g | Freq: Four times a day (QID) | INTRAVENOUS | Status: AC
  Administered 2020-06-30 – 2020-07-01 (×3): 2 g via INTRAVENOUS
  Filled 2020-06-30 (×3): qty 100

## 2020-06-30 MED ORDER — ROPIVACAINE HCL 7.5 MG/ML IJ SOLN
INTRAMUSCULAR | Status: DC | PRN
  Administered 2020-06-30: 20 mL via PERINEURAL

## 2020-06-30 MED ORDER — HYDROMORPHONE HCL 1 MG/ML IJ SOLN: 0.2500 mg | INTRAMUSCULAR | Status: DC | PRN

## 2020-06-30 MED ORDER — LIDOCAINE 2% (20 MG/ML) 5 ML SYRINGE
INTRAMUSCULAR | Status: AC
  Filled 2020-06-30: qty 5

## 2020-06-30 MED ORDER — PROPOFOL 10 MG/ML IV BOLUS
INTRAVENOUS | Status: DC | PRN
  Administered 2020-06-30: 200 mg via INTRAVENOUS

## 2020-06-30 MED ORDER — PHENYLEPHRINE 40 MCG/ML (10ML) SYRINGE FOR IV PUSH (FOR BLOOD PRESSURE SUPPORT)
PREFILLED_SYRINGE | INTRAVENOUS | Status: AC
  Filled 2020-06-30: qty 10

## 2020-06-30 MED ORDER — LACTATED RINGERS IV SOLN: Freq: Once | INTRAVENOUS | Status: AC

## 2020-06-30 MED ORDER — SODIUM CHLORIDE 0.9 % IV SOLN: INTRAVENOUS | Status: DC

## 2020-06-30 MED ORDER — LIDOCAINE 2% (20 MG/ML) 5 ML SYRINGE
INTRAMUSCULAR | Status: DC | PRN
  Administered 2020-06-30: 100 mg via INTRAVENOUS

## 2020-06-30 MED ORDER — BISACODYL 10 MG RE SUPP: 10.0000 mg | Freq: Every day | RECTAL | Status: DC | PRN

## 2020-06-30 MED ORDER — CHLORHEXIDINE GLUCONATE 0.12 % MT SOLN
15.0000 mL | OROMUCOSAL | Status: AC
  Administered 2020-06-30: 15 mL via OROMUCOSAL

## 2020-06-30 MED ORDER — FENTANYL CITRATE (PF) 250 MCG/5ML IJ SOLN
INTRAMUSCULAR | Status: AC
  Filled 2020-06-30: qty 5

## 2020-06-30 MED ORDER — ASPIRIN EC 81 MG PO TBEC
81.0000 mg | DELAYED_RELEASE_TABLET | Freq: Two times a day (BID) | ORAL | Status: DC
  Administered 2020-07-01: 81 mg via ORAL
  Filled 2020-06-30: qty 1

## 2020-06-30 MED ORDER — OXYCODONE HCL 5 MG PO TABS: 5.0000 mg | ORAL_TABLET | ORAL | Status: DC | PRN

## 2020-06-30 MED ORDER — FENTANYL CITRATE (PF) 100 MCG/2ML IJ SOLN
INTRAMUSCULAR | Status: AC
  Filled 2020-06-30: qty 2

## 2020-06-30 MED ORDER — ONDANSETRON HCL 4 MG/2ML IJ SOLN: 4.0000 mg | Freq: Four times a day (QID) | INTRAMUSCULAR | Status: DC | PRN

## 2020-06-30 MED ORDER — INSULIN GLARGINE 100 UNIT/ML ~~LOC~~ SOLN
30.0000 [IU] | Freq: Every day | SUBCUTANEOUS | Status: DC
  Administered 2020-06-30 (×2): 30 [IU] via SUBCUTANEOUS
  Filled 2020-06-30 (×2): qty 0.3

## 2020-06-30 MED ORDER — ROCURONIUM BROMIDE 10 MG/ML (PF) SYRINGE
PREFILLED_SYRINGE | INTRAVENOUS | Status: AC
  Filled 2020-06-30: qty 10

## 2020-06-30 MED ORDER — KETAMINE HCL 10 MG/ML IJ SOLN
INTRAMUSCULAR | Status: AC
  Filled 2020-06-30: qty 1

## 2020-06-30 MED ORDER — EPHEDRINE 5 MG/ML INJ
INTRAVENOUS | Status: AC
  Filled 2020-06-30: qty 10

## 2020-06-30 MED ORDER — DIPHENHYDRAMINE HCL 50 MG/ML IJ SOLN: 50.0000 mg | Freq: Once | INTRAMUSCULAR | Status: DC

## 2020-06-30 MED ORDER — LACTATED RINGERS IV SOLN: INTRAVENOUS | Status: DC | PRN

## 2020-06-30 MED ORDER — MIDAZOLAM HCL 5 MG/5ML IJ SOLN
INTRAMUSCULAR | Status: DC | PRN
  Administered 2020-06-30: 2 mg via INTRAVENOUS

## 2020-06-30 MED ORDER — FENTANYL CITRATE (PF) 100 MCG/2ML IJ SOLN
50.0000 ug | INTRAMUSCULAR | Status: AC
  Administered 2020-06-30: 50 ug via INTRAVENOUS

## 2020-06-30 MED ORDER — SODIUM CHLORIDE 0.9 % IV SOLN
8.0000 mg | Freq: Once | INTRAVENOUS | Status: AC
  Administered 2020-06-30: 8 mg via INTRAVENOUS
  Filled 2020-06-30: qty 4

## 2020-06-30 MED ORDER — METOCLOPRAMIDE HCL 5 MG/ML IJ SOLN: 5.0000 mg | Freq: Three times a day (TID) | INTRAMUSCULAR | Status: DC | PRN

## 2020-06-30 MED ORDER — HYDROMORPHONE HCL 1 MG/ML IJ SOLN: 1.0000 mg | INTRAMUSCULAR | Status: DC | PRN

## 2020-06-30 MED ORDER — DEXAMETHASONE SODIUM PHOSPHATE 10 MG/ML IJ SOLN
INTRAMUSCULAR | Status: AC
  Filled 2020-06-30: qty 1

## 2020-06-30 MED ORDER — OXYCODONE-ACETAMINOPHEN 5-325 MG PO TABS
2.0000 | ORAL_TABLET | ORAL | Status: DC | PRN
  Administered 2020-06-30: 2 via ORAL
  Filled 2020-06-30: qty 2

## 2020-06-30 MED ORDER — DEXTROSE 5 % IV SOLN
3.0000 g | INTRAVENOUS | Status: AC
  Administered 2020-06-30: 3 g via INTRAVENOUS
  Filled 2020-06-30: qty 3

## 2020-06-30 MED ORDER — ONDANSETRON HCL 4 MG/2ML IJ SOLN: 4.0000 mg | Freq: Once | INTRAMUSCULAR | Status: DC | PRN

## 2020-06-30 MED ORDER — SUGAMMADEX SODIUM 500 MG/5ML IV SOLN
INTRAVENOUS | Status: AC
  Filled 2020-06-30: qty 5

## 2020-06-30 MED ORDER — ACETAMINOPHEN 500 MG PO TABS: 1000.0000 mg | ORAL_TABLET | Freq: Four times a day (QID) | ORAL | Status: DC

## 2020-06-30 MED ORDER — FENTANYL CITRATE (PF) 100 MCG/2ML IJ SOLN
INTRAMUSCULAR | Status: DC | PRN
  Administered 2020-06-30: 50 ug via INTRAVENOUS

## 2020-06-30 SURGICAL SUPPLY — 69 items
BAG ZIPLOCK 12X15 (MISCELLANEOUS) ×2 IMPLANT
BANDAGE ESMARK 6X9 LF (GAUZE/BANDAGES/DRESSINGS) ×1 IMPLANT
BIT DRILL 3.5X122MM AO FIT (BIT) ×1 IMPLANT
BLADE SURG 15 STRL LF DISP TIS (BLADE) ×1 IMPLANT
BLADE SURG 15 STRL SS (BLADE) ×1
BNDG COHESIVE 6X5 TAN STRL LF (GAUZE/BANDAGES/DRESSINGS) ×2 IMPLANT
BNDG ELASTIC 4X5.8 VLCR STR LF (GAUZE/BANDAGES/DRESSINGS) ×2 IMPLANT
BNDG ELASTIC 6X10 VLCR STRL LF (GAUZE/BANDAGES/DRESSINGS) ×2 IMPLANT
BNDG ESMARK 6X9 LF (GAUZE/BANDAGES/DRESSINGS) ×2
BNDG GAUZE ELAST 4 BULKY (GAUZE/BANDAGES/DRESSINGS) ×2 IMPLANT
CHLORAPREP W/TINT 26 (MISCELLANEOUS) ×2 IMPLANT
CLSR STERI-STRIP ANTIMIC 1/2X4 (GAUZE/BANDAGES/DRESSINGS) IMPLANT
COVER MAYO STAND STRL (DRAPES) ×2 IMPLANT
COVER SURGICAL LIGHT HANDLE (MISCELLANEOUS) ×2 IMPLANT
COVER WAND RF STERILE (DRAPES) ×2 IMPLANT
CUFF TOURN SGL QUICK 34 (TOURNIQUET CUFF) ×1
CUFF TRNQT CYL 34X4.125X (TOURNIQUET CUFF) ×1 IMPLANT
DECANTER SPIKE VIAL GLASS SM (MISCELLANEOUS) IMPLANT
DRAPE C-ARM 42X120 X-RAY (DRAPES) IMPLANT
DRAPE IMP U-DRAPE 54X76 (DRAPES) ×2 IMPLANT
DRAPE U-SHAPE 47X51 STRL (DRAPES) ×2 IMPLANT
DRILL 2.6X122MM WL AO SHAFT (BIT) ×1 IMPLANT
DRSG EMULSION OIL 3X3 NADH (GAUZE/BANDAGES/DRESSINGS) ×2 IMPLANT
DRSG PAD ABDOMINAL 8X10 ST (GAUZE/BANDAGES/DRESSINGS) ×3 IMPLANT
ELECT REM PT RETURN 15FT ADLT (MISCELLANEOUS) ×2 IMPLANT
GAUZE SPONGE 4X4 12PLY STRL (GAUZE/BANDAGES/DRESSINGS) ×2 IMPLANT
GLOVE BIO SURGEON STRL SZ7.5 (GLOVE) ×4 IMPLANT
GLOVE BIOGEL PI IND STRL 7.5 (GLOVE) ×1 IMPLANT
GLOVE BIOGEL PI IND STRL 8 (GLOVE) ×1 IMPLANT
GLOVE BIOGEL PI INDICATOR 7.5 (GLOVE) ×1
GLOVE BIOGEL PI INDICATOR 8 (GLOVE) ×1
GOWN STRL REUS W/ TWL LRG LVL3 (GOWN DISPOSABLE) ×1 IMPLANT
GOWN STRL REUS W/ TWL XL LVL3 (GOWN DISPOSABLE) ×1 IMPLANT
GOWN STRL REUS W/TWL LRG LVL3 (GOWN DISPOSABLE) ×1
GOWN STRL REUS W/TWL XL LVL3 (GOWN DISPOSABLE) ×1
KIT BASIN OR (CUSTOM PROCEDURE TRAY) ×2 IMPLANT
KIT TURNOVER KIT A (KITS) IMPLANT
MANIFOLD NEPTUNE II (INSTRUMENTS) ×2 IMPLANT
NEEDLE HYPO 22GX1.5 SAFETY (NEEDLE) ×2 IMPLANT
NS IRRIG 1000ML POUR BTL (IV SOLUTION) ×2 IMPLANT
PACK ORTHO EXTREMITY (CUSTOM PROCEDURE TRAY) ×2 IMPLANT
PAD CAST 4YDX4 CTTN HI CHSV (CAST SUPPLIES) ×2 IMPLANT
PADDING CAST COTTON 4X4 STRL (CAST SUPPLIES) ×2
PADDING CAST COTTON 6X4 STRL (CAST SUPPLIES) ×2 IMPLANT
PENCIL SMOKE EVACUATOR (MISCELLANEOUS) IMPLANT
PLATE TUBUAL 1/3 6H (Plate) ×1 IMPLANT
PROTECTOR NERVE ULNAR (MISCELLANEOUS) ×2 IMPLANT
SCREW CANC FT 16X4X2.5XHEX (Screw) IMPLANT
SCREW CANCELLOUS 4.0X16MM (Screw) ×2 IMPLANT
SCREW CANCELLOUS FT 4.0X18MM (Screw) ×1 IMPLANT
SCREW CORTEX ST MATTA 3.5X18MM (Screw) ×3 IMPLANT
SCREW CORTEX ST MATTA 3.5X24 (Screw) ×1 IMPLANT
SPLINT PLASTER CAST XFAST 5X30 (CAST SUPPLIES) IMPLANT
SPLINT PLASTER XFAST SET 5X30 (CAST SUPPLIES) ×1
SUCTION FRAZIER HANDLE 10FR (MISCELLANEOUS) ×1
SUCTION TUBE FRAZIER 10FR DISP (MISCELLANEOUS) ×1 IMPLANT
SUT ETHILON 3 0 PS 1 (SUTURE) ×6 IMPLANT
SUT MNCRL AB 4-0 PS2 18 (SUTURE) IMPLANT
SUT MON AB 2-0 CT1 36 (SUTURE) ×4 IMPLANT
SUT MON AB 3-0 SH 27 (SUTURE)
SUT MON AB 3-0 SH27 (SUTURE) IMPLANT
SUT VIC AB 0 CT1 36 (SUTURE) ×2 IMPLANT
SUT VIC AB 2-0 SH 27 (SUTURE)
SUT VIC AB 2-0 SH 27XBRD (SUTURE) IMPLANT
SYR CONTROL 10ML LL (SYRINGE) ×2 IMPLANT
TOWEL OR 17X26 10 PK STRL BLUE (TOWEL DISPOSABLE) ×2 IMPLANT
TOWEL OR NON WOVEN STRL DISP B (DISPOSABLE) ×2 IMPLANT
UNDERPAD 30X36 HEAVY ABSORB (UNDERPADS AND DIAPERS) ×2 IMPLANT
YANKAUER SUCT BULB TIP 10FT TU (MISCELLANEOUS) ×1 IMPLANT

## 2020-06-30 NOTE — ED Notes (Signed)
Pt to be seen by Dr. Eulah Pont to repair his ankle 06/30/20, mid morning

## 2020-06-30 NOTE — Interval H&P Note (Signed)
History and Physical Interval Note:  06/30/2020 7:21 AM  Jason Levy.  has presented today for surgery, with the diagnosis of right ankle fracture.  The various methods of treatment have been discussed with the patient and family. After consideration of risks, benefits and other options for treatment, the patient has consented to  Procedure(s): OPEN REDUCTION INTERNAL FIXATION (ORIF) ANKLE FRACTURE (Right) as a surgical intervention.  The patient's history has been reviewed, patient examined, no change in status, stable for surgery.  I have reviewed the patient's chart and labs.  Questions were answered to the patient's satisfaction.     Sheral Apley

## 2020-06-30 NOTE — Anesthesia Procedure Notes (Signed)
Anesthesia Regional Block: Popliteal block   Pre-Anesthetic Checklist: ,, timeout performed, Correct Patient, Correct Site, Correct Laterality, Correct Procedure, Correct Position, site marked, Risks and benefits discussed,  Surgical consent,  Pre-op evaluation,  At surgeon's request and post-op pain management  Laterality: Right  Prep: chloraprep       Needles:  Injection technique: Single-shot  Needle Type: Echogenic Stimulator Needle     Needle Length: 9cm  Needle Gauge: 21   Needle insertion depth: 7 cm   Additional Needles:   Procedures:,,,, ultrasound used (permanent image in chart),,,,  Narrative:  Start time: 06/30/2020 11:10 AM End time: 06/30/2020 11:15 AM Injection made incrementally with aspirations every 5 mL.  Performed by: Personally  Anesthesiologist: Mal Amabile, MD  Additional Notes: Timeout performed. Patient sedated. Relevant anatomy ID'd using [place]. Incremental 2-70ml injection of LA with frequent aspiration. Patient tolerated procedure well.        Right Popliteal Block

## 2020-06-30 NOTE — Progress Notes (Signed)
Orthopedic Tech Progress Note Patient Details:  Jason Levy Oct 05, 1975 [id number]  Ortho Devices Type of Ortho Device: Knee Immobilizer Ortho Device/Splint Location: RLE Ortho Device/Splint Interventions: Ordered, Application   Post Interventions Patient Tolerated: Well Instructions Provided: Care of device   [name] 06/30/2020, 2:54 PM

## 2020-06-30 NOTE — Anesthesia Postprocedure Evaluation (Signed)
Anesthesia Post Note  Patient: Jason Levy.  Procedure(s) Performed: OPEN REDUCTION INTERNAL FIXATION (ORIF) ANKLE FRACTURE (Right Ankle)     Patient location during evaluation: PACU Anesthesia Type: General Level of consciousness: awake and alert and oriented Pain management: pain level controlled Vital Signs Assessment: post-procedure vital signs reviewed and stable Respiratory status: spontaneous breathing, nonlabored ventilation and respiratory function stable Cardiovascular status: blood pressure returned to baseline and stable Postop Assessment: no apparent nausea or vomiting Anesthetic complications: no   No complications documented.  Last Vitals:  Vitals:   06/30/20 1315 06/30/20 1330  BP: 139/75 129/68  Pulse: 81 95  Resp: 13 19  Temp:    SpO2: 100% 97%    Last Pain:  Vitals:   06/30/20 1330  TempSrc:   PainSc: 0-No pain        RLE Motor Response: Purposeful movement (06/30/20 1330) RLE Sensation: Decreased (06/30/20 1330)      Caelum Federici A.

## 2020-06-30 NOTE — Transfer of Care (Signed)
Immediate Anesthesia Transfer of Care Note  Patient: Jason Levy.  Procedure(s) Performed: OPEN REDUCTION INTERNAL FIXATION (ORIF) ANKLE FRACTURE (Right Ankle)  Patient Location: PACU  Anesthesia Type:GA combined with regional for post-op pain  Level of Consciousness: awake and alert   Airway & Oxygen Therapy: Patient Spontanous Breathing and Patient connected to face mask oxygen  Post-op Assessment: Report given to RN and Post -op Vital signs reviewed and stable  Post vital signs: Reviewed and stable  Last Vitals:  Vitals Value Taken Time  BP 145/70 06/30/20 1307  Temp    Pulse 96 06/30/20 1308  Resp 20 06/30/20 1308  SpO2 100 % 06/30/20 1308  Vitals shown include unvalidated device data.  Last Pain:  Vitals:   06/30/20 1044  TempSrc: Oral  PainSc:          Complications: No complications documented.

## 2020-06-30 NOTE — Discharge Instructions (Signed)
Elevate leg and apply ice to reduce pain and swelling. If needed, you may increase pain medication for the first few days post op to 2 tablets every 4 hours.  Stop as needed pain medication as soon as you are able.  Maintain knee immobilizer at all times until follow up.  Diet: As you were doing prior to hospitalization   Dressing:  Keep dressings on and dry until follow up.  Activity:  Increase activity slowly as tolerated, but follow the weight bearing instructions below.  The rules on driving is that you can not be taking narcotics while you drive, and you must feel in control of the vehicle.    Weight Bearing:   As tolerated while wearing knee immobilizer.  To prevent constipation: you may use a stool softener such as -  Colace (over the counter) 100 mg by mouth twice a day  Drink plenty of fluids (prune juice may be helpful) and high fiber foods Miralax (over the counter) for constipation as needed.    Itching:  If you experience itching with your medications, try taking only a single pain pill, or even half a pain pill at a time.  You can also use benadryl over the counter for itching or also to help with sleep.   Precautions:  If you experience chest pain or shortness of breath - call 911 immediately for transfer to the hospital emergency department!!  If you develop a fever greater that 101 F, purulent drainage from wound, increased redness or drainage from wound, or calf pain -- Call the office at [phone]                                                 Follow- Up Appointment:  Please call for an appointment to be seen in 2 weeks [place] - [phone]   

## 2020-06-30 NOTE — Progress Notes (Signed)
Assisted Dr. Mal Amabile with right Popliteal block and right Adductor canal block. Side rails up, monitors on throughout procedure. See vital signs in flow sheet. Tolerated Procedure well.
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
## 2020-06-30 NOTE — Anesthesia Procedure Notes (Signed)
Procedure Name: LMA Insertion Date/Time: 06/30/2020 11:45 AM Performed by: Florene Route, CRNA Patient Re-evaluated:Patient Re-evaluated prior to induction Oxygen Delivery Method: Circle system utilized Preoxygenation: Pre-oxygenation with 100% oxygen Induction Type: IV induction Ventilation: Mask ventilation without difficulty LMA: LMA inserted LMA Size: 5.0 Number of attempts: 1 Placement Confirmation: positive ETCO2 and breath sounds checked- equal and bilateral Tube secured with: Tape Dental Injury: Teeth and Oropharynx as per pre-operative assessment
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
## 2020-06-30 NOTE — Anesthesia Procedure Notes (Signed)
Anesthesia Regional Block: Adductor canal block   Pre-Anesthetic Checklist: ,, timeout performed, Correct Patient, Correct Site, Correct Laterality, Correct Procedure, Correct Position, site marked, Risks and benefits discussed,  Surgical consent,  Pre-op evaluation,  At surgeon's request and post-op pain management  Laterality: Right  Prep: chloraprep       Needles:  Injection technique: Single-shot  Needle Type: Echogenic Stimulator Needle     Needle Length: 9cm  Needle Gauge: 21   Needle insertion depth: 8 cm   Additional Needles:   Procedures:,,,, ultrasound used (permanent image in chart),,,,  Narrative:  Start time: 06/30/2020 11:15 AM End time: 06/30/2020 11:20 AM Injection made incrementally with aspirations every 5 mL.  Performed by: Personally  Anesthesiologist: Mal Amabile, MD  Additional Notes: Timeout performed. Patient sedated. Relevant anatomy ID'd using [place]. Incremental 2-83ml injection of LA with frequent aspiration. Patient tolerated procedure well.        Right Adductor Canal Block
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
## 2020-07-01 DIAGNOSIS — S82891A Other fracture of right lower leg, initial encounter for closed fracture: Secondary | ICD-10-CM | POA: Diagnosis not present

## 2020-07-01 LAB — CBC WITH DIFFERENTIAL/PLATELET
Abs Immature Granulocytes: 0.05 10*3/uL (ref 0.00–0.07)
Basophils Absolute: 0 10*3/uL (ref 0.0–0.1)
Basophils Relative: 0 %
Eosinophils Absolute: 0 10*3/uL (ref 0.0–0.5)
Eosinophils Relative: 0 %
HCT: 40.9 % (ref 39.0–52.0)
Hemoglobin: 13.1 g/dL (ref 13.0–17.0)
Immature Granulocytes: 0 %
Lymphocytes Relative: 14 %
Lymphs Abs: 2.1 10*3/uL (ref 0.7–4.0)
MCH: 26.7 pg (ref 26.0–34.0)
MCHC: 32 g/dL (ref 30.0–36.0)
MCV: 83.3 fL (ref 80.0–100.0)
Monocytes Absolute: 1.3 10*3/uL — ABNORMAL HIGH (ref 0.1–1.0)
Monocytes Relative: 9 %
Neutro Abs: 11.1 10*3/uL — ABNORMAL HIGH (ref 1.7–7.7)
Neutrophils Relative %: 77 %
Platelets: 248 10*3/uL (ref 150–400)
RBC: 4.91 MIL/uL (ref 4.22–5.81)
RDW: 13.4 % (ref 11.5–15.5)
WBC: 14.5 10*3/uL — ABNORMAL HIGH (ref 4.0–10.5)
nRBC: 0 % (ref 0.0–0.2)

## 2020-07-01 LAB — GLUCOSE, CAPILLARY
Glucose-Capillary: 162 mg/dL — ABNORMAL HIGH (ref 70–99)
Glucose-Capillary: 105 mg/dL — ABNORMAL HIGH (ref 70–99)

## 2020-07-01 MED ORDER — ACETAMINOPHEN 500 MG PO TABS: 1000.0000 mg | ORAL_TABLET | Freq: Four times a day (QID) | ORAL | 0 refills | Status: AC
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
## 2020-07-01 NOTE — Progress Notes (Signed)
    Subjective: Patient reports pain as mild as the block is still in effect.  Tolerating diet.  Urinating.  +Flatus.  No CP, SOB.   OOB with PT  Objective:   VITALS:   Vitals:   06/30/20 1559 06/30/20 2111 06/30/20 2148 07/01/20 0559  BP: 136/65 133/76  (!) 147/74  Pulse: 86 98  82  Resp: 16 17 18 17   Temp:  98.8 F (37.1 C)  98.4 F (36.9 C)  TempSrc:  Oral  Oral  SpO2: 96% 98%  99%  Weight:      Height:       CBC Latest Ref Rng & Units 07/01/2020 06/30/2020 06/29/2020  WBC 4.0 - 10.5 K/uL 14.5(H) 12.4(H) 16.8(H)  Hemoglobin 13.0 - 17.0 g/dL 07/01/2020 26.3 78.5  Hematocrit 39 - 52 % 40.9 41.5 40.8  Platelets 150 - 400 K/uL 248 207 228   BMP Latest Ref Rng & Units 06/30/2020 06/29/2020 10/18/2019  Glucose 70 - 99 mg/dL 12/16/2019) 027(X) 412(I)  BUN 6 - 20 mg/dL 18 17 20   Creatinine 0.61 - 1.24 mg/dL 786(V 6.72  Sodium 135 - 145 mmol/L 134(L) 137 134(L)  Potassium 3.5 - 5.1 mmol/L 4.4 3.9 3.8  Chloride 98 - 111 mmol/L 100 103 101  CO2 22 - 32 mmol/L 23 23 24   Calcium 8.9 - 10.3 mg/dL 0.94) 7.09) 9.1   Intake/Output      10/19 0701 - 10/20 0700 10/20 0701 - 10/21 0700   P.O. 480    I.V. (mL/kg) 2079.5 (15)    IV Piggyback 250.1    Total Intake(mL/kg) 2809.6 (20.3)    Urine (mL/kg/hr) 3470 (1)    Stool 0    Blood 25    Total Output 3495    Net -685.4         Urine Occurrence 1 x    Stool Occurrence 0 x       Physical Exam: General: NAD.  Resting comfortably in bed this morning Resp: No increased wob Cardio: regular rate and rhythm ABD soft Neurologically intact MSK Neurovascularly intact Sensation intact distally Intact pulses distally Dorsiflexion/Plantar flexion intact Incision: dressing C/D/I Neurovascular intact  Assessment: 1 Day Post-Op  S/P Procedure(s) (LRB): OPEN REDUCTION INTERNAL FIXATION (ORIF) ANKLE FRACTURE (Right) by Dr. 11/20. 11/20 on 06/30/20  Principal Problem:   Ankle fracture Active Problems:   Type 2 diabetes mellitus  with hyperglycemia (HCC)   Essential hypertension   Closed right ankle fracture, initial encounter      Plan:   Up with therapy Incentive Spirometry Elevate and Apply ice  Weightbearing: NWB RLE Insicional and dressing care: Dressings left intact until follow-up Orthopedic device(s): Knee walker Showering: Keep dressing dry VTE prophylaxis: Aspirin 81mg  BID 30 days, SCDs, ambulation Pain control: oxycodone Follow - up plan: 2 weeks Contact information:  Jewel Baize MD, Eulah Pont PA-C  Dispo: Home when cleared per medical team. Appreciate medical team assistance.     07/02/20, PA-C 07/01/2020, 7:38 AM

## 2020-07-01 NOTE — Progress Notes (Signed)
Orders placed for HHPT, however, have been unable to secure any local HH agency who will accept insurance or have staffing to provide service.  Have discussed with PT and with pt and both agreed that pt did well with therapy sessions today and pt feels comfortable with current level of mobility.  Discussed option of OPPT if recommended once the MD has cleared pt to weight bear on LE.    Zaryia Markel, LCSW

## 2020-07-01 NOTE — Plan of Care (Signed)
Patient discharged home in stable condition 

## 2020-07-01 NOTE — Op Note (Signed)
06/30/2020  1:55 PM  PATIENT:  [name].    PRE-OPERATIVE DIAGNOSIS:  right ankle fracture  POST-OPERATIVE DIAGNOSIS:  Same  PROCEDURE:  OPEN REDUCTION INTERNAL FIXATION (ORIF) ANKLE FRACTURE  SURGEON:  Sheral Apley, MD  ASSISTANT: Daun Peacock, PA-C, he was present and scrubbed throughout the case, critical for completion in a timely fashion, and for retraction, instrumentation, and closure.   ANESTHESIA:   gen   PREOPERATIVE INDICATIONS:  Jason Levy. is a  44 y.o. male with a diagnosis of right ankle fracture who failed conservative measures and elected for surgical management.    The risks benefits and alternatives were discussed with the patient preoperatively including but not limited to the risks of infection, bleeding, nerve injury, cardiopulmonary complications, the need for revision surgery, among others, and the patient was willing to proceed.  OPERATIVE IMPLANTS: stryker plate  OPERATIVE FINDINGS: Unstable ankle fracture. Stable syndesmosis post op  BLOOD LOSS: min  COMPLICATIONS: none  TOURNIQUET TIME: 45 min  OPERATIVE PROCEDURE:  Patient was identified in the preoperative holding area and site was marked by me He was transported to the operating theater and placed on the table in supine position taking care to pad all bony prominences. After a preincinduction time out anesthesia was induced. The right lower extremity was prepped and draped in normal sterile fashion and a pre-incision timeout was performed. [name]. received ancef for preoperative antibiotics.   I made a lateral incision of roughly 7 cm dissection was carried down sharply to the distal fibula and then spreading dissection was used proximally to protect the superficial peroneal nerve. I sharply incised the periosteum and took care to protect the peroneal tendons. I then debrided the fracture site and performed a reduction maneuver which was held in place with a clamp.    I placed a lag screw across the fracture  I then selected a 6-hole one third tubular plate and placed in a neutralization fashion care was taken distally so as not to penetrate the joint with the cancellus screws.  Syndesmosis was stable.  I assesed the posterior mal piece and it was small enough to not require fixation as it involved less than 20% of the articular surface  The wound was then thoroughly irrigated and closed using a 0 Vicryl and absorbable Monocryl sutures. He was placed in a short leg splint.   POST OPERATIVE PLAN: Non-weightbearing. DVT prophylaxis will consist of mobilization and chemical px
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
## 2020-07-17 ENCOUNTER — Encounter (HOSPITAL_COMMUNITY): Payer: Self-pay | Admitting: Orthopedic Surgery

## 2020-09-22 DIAGNOSIS — Z8616 Personal history of COVID-19: Secondary | ICD-10-CM

## 2020-09-22 HISTORY — DX: Personal history of COVID-19: Z86.16

## 2020-10-13 ENCOUNTER — Other Ambulatory Visit: Payer: Self-pay | Admitting: Orthopaedic Surgery

## 2020-10-13 DIAGNOSIS — M25571 Pain in right ankle and joints of right foot: Secondary | ICD-10-CM

## 2020-10-14 ENCOUNTER — Other Ambulatory Visit: Payer: Self-pay

## 2020-10-14 ENCOUNTER — Other Ambulatory Visit: Payer: Commercial Managed Care - PPO

## 2020-10-14 ENCOUNTER — Ambulatory Visit
Admission: RE | Admit: 2020-10-14 | Discharge: 2020-10-14 | Disposition: A | Discharge status: Home / Self Care | Payer: Commercial Managed Care - PPO | Source: Ambulatory Visit | Attending: Orthopaedic Surgery | Admitting: Orthopaedic Surgery

## 2020-10-14 DIAGNOSIS — M25571 Pain in right ankle and joints of right foot: Secondary | ICD-10-CM

## 2020-10-19 ENCOUNTER — Other Ambulatory Visit: Payer: Self-pay | Admitting: Orthopaedic Surgery

## 2020-11-10 ENCOUNTER — Encounter (HOSPITAL_BASED_OUTPATIENT_CLINIC_OR_DEPARTMENT_OTHER): Payer: Self-pay | Admitting: Orthopaedic Surgery

## 2020-11-10 ENCOUNTER — Other Ambulatory Visit: Payer: Self-pay

## 2020-11-11 ENCOUNTER — Encounter (HOSPITAL_BASED_OUTPATIENT_CLINIC_OR_DEPARTMENT_OTHER)
Admission: RE | Admit: 2020-11-11 | Discharge: 2020-11-11 | Disposition: A | Discharge status: Home / Self Care | Payer: Commercial Managed Care - PPO | Source: Ambulatory Visit | Attending: Orthopaedic Surgery | Admitting: Orthopaedic Surgery

## 2020-11-11 DIAGNOSIS — E119 Type 2 diabetes mellitus without complications: Secondary | ICD-10-CM | POA: Insufficient documentation

## 2020-11-11 DIAGNOSIS — I10 Essential (primary) hypertension: Secondary | ICD-10-CM | POA: Diagnosis not present

## 2020-11-11 DIAGNOSIS — Z01818 Encounter for other preprocedural examination: Secondary | ICD-10-CM | POA: Insufficient documentation

## 2020-11-11 LAB — BASIC METABOLIC PANEL
Anion gap: 12 (ref 5–15)
BUN: 18 mg/dL (ref 6–20)
CO2: 23 mmol/L (ref 22–32)
Calcium: 9.6 mg/dL (ref 8.9–10.3)
Chloride: 99 mmol/L (ref 98–111)
Creatinine, Ser: 0.87 mg/dL (ref 0.61–1.24)
GFR, Estimated: >60 mL/min (ref >60)
Glucose, Bld: 178 mg/dL — ABNORMAL HIGH (ref 70–99)
Potassium: 4.4 mmol/L (ref 3.5–5.1)
Sodium: 134 mmol/L — ABNORMAL LOW (ref 135–145)

## 2020-11-11 LAB — HEMOGLOBIN A1C
Hgb A1c MFr Bld: 7 % — ABNORMAL HIGH (ref 4.8–5.6)
Mean Plasma Glucose: 154.2 mg/dL

## 2020-11-11 NOTE — Progress Notes (Signed)
      Enhanced Recovery after Surgery for Orthopedics Enhanced Recovery after Surgery is a protocol used to improve the stress on your body and your recovery after surgery.  Patient Instructions  . The night before surgery:  o No food after midnight. ONLY clear liquids after midnight  . The day of surgery (if you do NOT have diabetes):  o Drink ONE (1) Pre-Surgery Clear Ensure as directed.   o This drink was given to you during your hospital  pre-op appointment visit. o The pre-op nurse will instruct you on the time to drink the  Pre-Surgery Ensure depending on your surgery time. o Finish the drink at the designated time by the pre-op nurse.  o Nothing else to drink after completing the  Pre-Surgery Clear Ensure.  . The day of surgery (if you have diabetes): o Drink ONE (1) Gatorade 2 (G2) as directed. o This drink was given to you during your hospital  pre-op appointment visit.  o The pre-op nurse will instruct you on the time to drink the   Gatorade 2 (G2) depending on your surgery time. o Color of the Gatorade may vary. Red is not allowed. o Nothing else to drink after completing the  Gatorade 2 (G2).         If you have questions, please contact your surgeon's office. *Gatorade given to patient since patient has a history of diabetes.

## 2020-11-13 ENCOUNTER — Other Ambulatory Visit (HOSPITAL_COMMUNITY): Payer: Commercial Managed Care - PPO

## 2020-11-17 ENCOUNTER — Encounter (HOSPITAL_BASED_OUTPATIENT_CLINIC_OR_DEPARTMENT_OTHER): Payer: Self-pay | Admitting: Orthopaedic Surgery

## 2020-11-17 ENCOUNTER — Ambulatory Visit (HOSPITAL_BASED_OUTPATIENT_CLINIC_OR_DEPARTMENT_OTHER): Payer: Commercial Managed Care - PPO | Admitting: Certified Registered"

## 2020-11-17 ENCOUNTER — Other Ambulatory Visit: Payer: Self-pay

## 2020-11-17 ENCOUNTER — Ambulatory Visit (HOSPITAL_BASED_OUTPATIENT_CLINIC_OR_DEPARTMENT_OTHER)
Admission: RE | Admit: 2020-11-17 | Discharge: 2020-11-17 | Disposition: A | Discharge status: Home / Self Care | Payer: Commercial Managed Care - PPO | Attending: Orthopaedic Surgery | Admitting: Orthopaedic Surgery

## 2020-11-17 ENCOUNTER — Encounter (HOSPITAL_BASED_OUTPATIENT_CLINIC_OR_DEPARTMENT_OTHER)
Admission: RE | Disposition: A | Discharge status: Home / Self Care | Payer: Self-pay | Source: Home / Self Care | Attending: Orthopaedic Surgery

## 2020-11-17 DIAGNOSIS — S92101K Unspecified fracture of right talus, subsequent encounter for fracture with nonunion: Secondary | ICD-10-CM | POA: Insufficient documentation

## 2020-11-17 DIAGNOSIS — X58XXXD Exposure to other specified factors, subsequent encounter: Secondary | ICD-10-CM | POA: Insufficient documentation

## 2020-11-17 DIAGNOSIS — Y831 Surgical operation with implant of artificial internal device as the cause of abnormal reaction of the patient, or of later complication, without mention of misadventure at the time of the procedure: Secondary | ICD-10-CM | POA: Diagnosis not present

## 2020-11-17 DIAGNOSIS — Z8616 Personal history of COVID-19: Secondary | ICD-10-CM | POA: Insufficient documentation

## 2020-11-17 DIAGNOSIS — Z794 Long term (current) use of insulin: Secondary | ICD-10-CM | POA: Insufficient documentation

## 2020-11-17 DIAGNOSIS — T84196A Other mechanical complication of internal fixation device of bone of right lower leg, initial encounter: Secondary | ICD-10-CM | POA: Diagnosis not present

## 2020-11-17 DIAGNOSIS — Z87891 Personal history of nicotine dependence: Secondary | ICD-10-CM | POA: Insufficient documentation

## 2020-11-17 DIAGNOSIS — S93401A Sprain of unspecified ligament of right ankle, initial encounter: Secondary | ICD-10-CM | POA: Insufficient documentation

## 2020-11-17 DIAGNOSIS — Z880 Allergy status to penicillin: Secondary | ICD-10-CM | POA: Insufficient documentation

## 2020-11-17 DIAGNOSIS — Z79890 Hormone replacement therapy (postmenopausal): Secondary | ICD-10-CM | POA: Insufficient documentation

## 2020-11-17 DIAGNOSIS — Z79899 Other long term (current) drug therapy: Secondary | ICD-10-CM | POA: Insufficient documentation

## 2020-11-17 DIAGNOSIS — Z7984 Long term (current) use of oral hypoglycemic drugs: Secondary | ICD-10-CM | POA: Insufficient documentation

## 2020-11-17 HISTORY — DX: Essential (primary) hypertension: I10

## 2020-11-17 HISTORY — PX: ARTHROTOMY: SHX5728

## 2020-11-17 HISTORY — DX: Sleep apnea, unspecified: G47.30

## 2020-11-17 HISTORY — PX: ORIF ANKLE FRACTURE: SHX5408

## 2020-11-17 LAB — GLUCOSE, CAPILLARY
Glucose-Capillary: 79 mg/dL (ref 70–99)
Glucose-Capillary: 134 mg/dL — ABNORMAL HIGH (ref 70–99)

## 2020-11-17 SURGERY — ARTHROTOMY
Anesthesia: General | Site: Ankle | Laterality: Right

## 2020-11-17 MED ORDER — PROPOFOL 10 MG/ML IV BOLUS
INTRAVENOUS | Status: DC | PRN
  Administered 2020-11-17: 200 mg via INTRAVENOUS
  Administered 2020-11-17: 150 mg via INTRAVENOUS
  Administered 2020-11-17: 50 mg via INTRAVENOUS

## 2020-11-17 MED ORDER — MIDAZOLAM HCL 2 MG/2ML IJ SOLN
2.0000 mg | Freq: Once | INTRAMUSCULAR | Status: AC
  Administered 2020-11-17: 2 mg via INTRAVENOUS

## 2020-11-17 MED ORDER — PHENYLEPHRINE 40 MCG/ML (10ML) SYRINGE FOR IV PUSH (FOR BLOOD PRESSURE SUPPORT)
PREFILLED_SYRINGE | INTRAVENOUS | Status: AC
  Filled 2020-11-17: qty 10

## 2020-11-17 MED ORDER — FENTANYL CITRATE (PF) 100 MCG/2ML IJ SOLN
50.0000 ug | Freq: Once | INTRAMUSCULAR | Status: AC
  Administered 2020-11-17: 50 ug via INTRAVENOUS

## 2020-11-17 MED ORDER — FENTANYL CITRATE (PF) 100 MCG/2ML IJ SOLN
INTRAMUSCULAR | Status: AC
  Filled 2020-11-17: qty 2

## 2020-11-17 MED ORDER — ROPIVACAINE HCL 5 MG/ML IJ SOLN
INTRAMUSCULAR | Status: DC | PRN
  Administered 2020-11-17: 20 mL via PERINEURAL

## 2020-11-17 MED ORDER — OXYCODONE HCL 5 MG PO TABS: 5.0000 mg | ORAL_TABLET | ORAL | 0 refills | Status: AC | PRN

## 2020-11-17 MED ORDER — BUPIVACAINE-EPINEPHRINE (PF) 0.5% -1:200000 IJ SOLN
INTRAMUSCULAR | Status: DC | PRN
  Administered 2020-11-17: 30 mL via PERINEURAL

## 2020-11-17 MED ORDER — PROPOFOL 10 MG/ML IV BOLUS
INTRAVENOUS | Status: AC
  Filled 2020-11-17: qty 20

## 2020-11-17 MED ORDER — DEXAMETHASONE SODIUM PHOSPHATE 4 MG/ML IJ SOLN
INTRAMUSCULAR | Status: DC | PRN
  Administered 2020-11-17: 4 mg via INTRAVENOUS

## 2020-11-17 MED ORDER — FENTANYL CITRATE (PF) 100 MCG/2ML IJ SOLN: 25.0000 ug | INTRAMUSCULAR | Status: DC | PRN

## 2020-11-17 MED ORDER — LACTATED RINGERS IV SOLN: INTRAVENOUS | Status: DC

## 2020-11-17 MED ORDER — ONDANSETRON HCL 4 MG/2ML IJ SOLN
INTRAMUSCULAR | Status: DC | PRN
  Administered 2020-11-17: 4 mg via INTRAVENOUS

## 2020-11-17 MED ORDER — AMISULPRIDE (ANTIEMETIC) 5 MG/2ML IV SOLN: 10.0000 mg | Freq: Once | INTRAVENOUS | Status: DC | PRN

## 2020-11-17 MED ORDER — CELECOXIB 200 MG PO CAPS: 200.0000 mg | ORAL_CAPSULE | Freq: Once | ORAL | Status: DC

## 2020-11-17 MED ORDER — CLINDAMYCIN PHOSPHATE 900 MG/50ML IV SOLN
INTRAVENOUS | Status: AC
  Filled 2020-11-17: qty 50

## 2020-11-17 MED ORDER — FENTANYL CITRATE (PF) 100 MCG/2ML IJ SOLN
INTRAMUSCULAR | Status: DC | PRN
  Administered 2020-11-17 (×2): 25 ug via INTRAVENOUS

## 2020-11-17 MED ORDER — MIDAZOLAM HCL 2 MG/2ML IJ SOLN
INTRAMUSCULAR | Status: AC
  Filled 2020-11-17: qty 2

## 2020-11-17 MED ORDER — CLINDAMYCIN PHOSPHATE 900 MG/50ML IV SOLN
900.0000 mg | INTRAVENOUS | Status: AC
  Administered 2020-11-17: 900 mg via INTRAVENOUS

## 2020-11-17 MED ORDER — ASPIRIN 325 MG PO TABS: 325.0000 mg | ORAL_TABLET | Freq: Every day | ORAL | 11 refills | Status: AC

## 2020-11-17 MED ORDER — ACETAMINOPHEN 500 MG PO TABS: 1000.0000 mg | ORAL_TABLET | Freq: Once | ORAL | Status: DC

## 2020-11-17 MED ORDER — LIDOCAINE 2% (20 MG/ML) 5 ML SYRINGE
INTRAMUSCULAR | Status: DC | PRN
  Administered 2020-11-17: 20 mg via INTRAVENOUS

## 2020-11-17 MED ORDER — PHENYLEPHRINE HCL (PRESSORS) 10 MG/ML IV SOLN
INTRAVENOUS | Status: DC | PRN
  Administered 2020-11-17 (×3): 40 ug via INTRAVENOUS

## 2020-11-17 MED ORDER — LIDOCAINE 2% (20 MG/ML) 5 ML SYRINGE
INTRAMUSCULAR | Status: AC
  Filled 2020-11-17: qty 5

## 2020-11-17 SURGICAL SUPPLY — 78 items
APL PRP STRL LF DISP 70% ISPRP (MISCELLANEOUS) ×2
APL SKNCLS STERI-STRIP NONHPOA (GAUZE/BANDAGES/DRESSINGS)
BANDAGE ESMARK 6X9 LF (GAUZE/BANDAGES/DRESSINGS) ×2 IMPLANT
BENZOIN TINCTURE PRP APPL 2/3 (GAUZE/BANDAGES/DRESSINGS) IMPLANT
BIT DRILL 2.2 CANN STRGHT (BIT) ×3 IMPLANT
BLADE SURG 15 STRL LF DISP TIS (BLADE) ×20 IMPLANT
BLADE SURG 15 STRL SS (BLADE) ×30
BNDG CMPR 9X6 STRL LF SNTH (GAUZE/BANDAGES/DRESSINGS) ×2
BNDG COHESIVE 4X5 TAN STRL (GAUZE/BANDAGES/DRESSINGS) ×3 IMPLANT
BNDG ELASTIC 6X5.8 VLCR STR LF (GAUZE/BANDAGES/DRESSINGS) ×6 IMPLANT
BNDG ESMARK 6X9 LF (GAUZE/BANDAGES/DRESSINGS) ×3
CHLORAPREP W/TINT 26 (MISCELLANEOUS) ×3 IMPLANT
COVER BACK TABLE 60X90IN (DRAPES) ×6 IMPLANT
COVER MAYO STAND STRL (DRAPES) ×3 IMPLANT
COVER SURGICAL LIGHT HANDLE (MISCELLANEOUS) ×3 IMPLANT
COVER WAND RF STERILE (DRAPES) IMPLANT
CUFF TOURN SGL QUICK 34 (TOURNIQUET CUFF)
CUFF TOURN SGL QUICK 44 (TOURNIQUET CUFF) ×3 IMPLANT
CUFF TRNQT CYL 34X4.125X (TOURNIQUET CUFF) IMPLANT
DECANTER SPIKE VIAL GLASS SM (MISCELLANEOUS) IMPLANT
DRAPE C-ARM 42X72 X-RAY (DRAPES) IMPLANT
DRAPE C-ARMOR (DRAPES) IMPLANT
DRAPE EXTREMITY T 121X128X90 (DISPOSABLE) ×3 IMPLANT
DRAPE IMP U-DRAPE 54X76 (DRAPES) ×3 IMPLANT
DRAPE OEC MINIVIEW 54X84 (DRAPES) ×3 IMPLANT
DRAPE U-SHAPE 47X51 STRL (DRAPES) ×3 IMPLANT
ELECT REM PT RETURN 9FT ADLT (ELECTROSURGICAL) ×3
ELECTRODE REM PT RTRN 9FT ADLT (ELECTROSURGICAL) ×2 IMPLANT
GAUZE SPONGE 4X4 12PLY STRL (GAUZE/BANDAGES/DRESSINGS) ×3 IMPLANT
GAUZE XEROFORM 1X8 LF (GAUZE/BANDAGES/DRESSINGS) ×3 IMPLANT
GLOVE SRG 8 PF TXTR STRL LF DI (GLOVE) ×2 IMPLANT
GLOVE SURG ENC TEXT LTX SZ7.5 (GLOVE) ×3 IMPLANT
GLOVE SURG LTX SZ6.5 (GLOVE) ×6 IMPLANT
GLOVE SURG UNDER POLY LF SZ6.5 (GLOVE) ×3 IMPLANT
GLOVE SURG UNDER POLY LF SZ7 (GLOVE) ×6 IMPLANT
GLOVE SURG UNDER POLY LF SZ8 (GLOVE) ×3
GOWN STRL REUS W/ TWL LRG LVL3 (GOWN DISPOSABLE) ×4 IMPLANT
GOWN STRL REUS W/ TWL XL LVL3 (GOWN DISPOSABLE) ×2 IMPLANT
GOWN STRL REUS W/TWL LRG LVL3 (GOWN DISPOSABLE) ×6
GOWN STRL REUS W/TWL XL LVL3 (GOWN DISPOSABLE) ×3
GUIDEWIRE .045 DBLE ENDED TRO (WIRE) ×6 IMPLANT
MANIFOLD NEPTUNE II (INSTRUMENTS) ×3 IMPLANT
NS IRRIG 1000ML POUR BTL (IV SOLUTION) ×3 IMPLANT
PACK BASIN DAY SURGERY FS (CUSTOM PROCEDURE TRAY) ×3 IMPLANT
PAD CAST 4YDX4 CTTN HI CHSV (CAST SUPPLIES) ×2 IMPLANT
PADDING CAST COTTON 4X4 STRL (CAST SUPPLIES) ×3
PADDING CAST SYNTHETIC 4 (CAST SUPPLIES) ×5
PADDING CAST SYNTHETIC 4X4 STR (CAST SUPPLIES) ×10 IMPLANT
PENCIL SMOKE EVACUATOR (MISCELLANEOUS) ×3 IMPLANT
PLATE LOCK THIRD TUB TI 6H (Plate) ×3 IMPLANT
PUTTY DBM STAGRAFT PLUS 5CC (Putty) ×3 IMPLANT
SCREW ANKLE FT 3.5X60 (Screw) ×3 IMPLANT
SCREW CANC TI FT ANKLE 4X18 (Screw) ×6 IMPLANT
SCREW CANN FT 3.5X34 (Screw) ×3 IMPLANT
SCREW CORT 3.5X18 THRD (Screw) ×3 IMPLANT
SCREW LO-PRO TI 3.5X16MM (Screw) ×3 IMPLANT
SCREW LP TITANIUM 3.5X40 (Screw) ×3 IMPLANT
SHEET MEDIUM DRAPE 40X70 STRL (DRAPES) ×3 IMPLANT
SLEEVE SCD COMPRESS KNEE MED (STOCKING) ×3 IMPLANT
SPLINT FAST PLASTER 5X30 (CAST SUPPLIES) ×20
SPLINT PLASTER CAST FAST 5X30 (CAST SUPPLIES) ×40 IMPLANT
SPONGE LAP 18X18 RF (DISPOSABLE) ×3 IMPLANT
STAPLER VISISTAT 35W (STAPLE) ×3 IMPLANT
STOCKINETTE 6  STRL (DRAPES) ×1
STOCKINETTE 6 STRL (DRAPES) ×2 IMPLANT
STRIP CLOSURE SKIN 1/2X4 (GAUZE/BANDAGES/DRESSINGS) IMPLANT
SUCTION FRAZIER HANDLE 10FR (MISCELLANEOUS) ×1
SUCTION TUBE FRAZIER 10FR DISP (MISCELLANEOUS) ×2 IMPLANT
SUT ETHILON 3 0 PS 1 (SUTURE) ×3 IMPLANT
SUT MNCRL AB 3-0 PS2 18 (SUTURE) ×6 IMPLANT
SUT PDS AB 2-0 CT2 27 (SUTURE) IMPLANT
SUT VIC AB 2-0 SH 27 (SUTURE) ×3
SUT VIC AB 2-0 SH 27XBRD (SUTURE) ×2 IMPLANT
SUT VIC AB 3-0 FS2 27 (SUTURE) IMPLANT
SYR BULB EAR ULCER 3OZ GRN STR (SYRINGE) ×3 IMPLANT
TOWEL GREEN STERILE FF (TOWEL DISPOSABLE) ×9 IMPLANT
TUBE CONNECTING 20X1/4 (TUBING) ×3 IMPLANT
UNDERPAD 30X36 HEAVY ABSORB (UNDERPADS AND DIAPERS) ×3 IMPLANT
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
## 2020-11-17 NOTE — Anesthesia Procedure Notes (Signed)
Anesthesia Regional Block: Popliteal block   Pre-Anesthetic Checklist: ,, timeout performed, Correct Patient, Correct Site, Correct Laterality, Correct Procedure, Correct Position, site marked, Risks and benefits discussed,  Surgical consent,  Pre-op evaluation,  At surgeon's request and post-op pain management  Laterality: Right  Prep: chloraprep       Needles:  Injection technique: Single-shot  Needle Type: Echogenic Needle     Needle Length: 9cm  Needle Gauge: 21     Additional Needles:   Procedures:,,,, ultrasound used (permanent image in chart),,,,  Narrative:  Start time: 11/17/2020 8:18 AM End time: 11/17/2020 8:24 AM Injection made incrementally with aspirations every 5 mL.  Performed by: Personally  Anesthesiologist: Marcene Duos, MD

## 2020-11-17 NOTE — Discharge Instructions (Signed)
*Blood Sugar 134 at 11:20am*  DR. Susa Simmonds FOOT & ANKLE SURGERY POST-OP INSTRUCTIONS   Pain Management 1. The numbing medicine and your leg will last around 18 hours, take a dose of your pain medicine as soon as you feel it wearing off to avoid rebound pain. 2. Keep your foot elevated above heart level.  Make sure that your heel hangs free ('floats'). 3. Take all prescribed medication as directed. 4. If taking narcotic pain medication you may want to use an over-the-counter stool softener to avoid constipation. 5. You may take over-the-counter NSAIDs (ibuprofen, naproxen, etc.) as well as over-the-counter acetaminophen as directed on the packaging as a supplement for your pain and may also use it to wean away from the prescription medication.  Activity ? Non-weightbearing ? Keep splint intact  First Postoperative Visit 1. Your first postop visit will be at least 2 weeks after surgery.  This should be scheduled when you schedule surgery. 2. If you do not have a postoperative visit scheduled please call [phone] to schedule an appointment. 3. At the appointment your incision will be evaluated for suture removal, x-rays will be obtained if necessary.  General Instructions 1. Swelling is very common after foot and ankle surgery.  It often takes 3 months for the foot and ankle to begin to feel comfortable.  Some amount of swelling will persist for 6-12 months. 2. DO NOT change the dressing.  If there is a problem with the dressing (too tight, loose, gets wet, etc.) please contact Dr. Donnie Mesa office. 3. DO NOT get the dressing wet.  For showers you can use an over-the-counter cast cover or wrap a washcloth around the top of your dressing and then cover it with a plastic bag and tape it to your leg. 4. DO NOT soak the incision (no tubs, pools, bath, etc.) until you have approval from Dr. Susa Simmonds.  Contact Dr. Garret Reddish office or go to Emergency Room if: 1. Temperature above 101 F. 2. Increasing  pain that is unresponsive to pain medication or elevation 3. Excessive redness or swelling in your foot 4. Dressing problems - excessive bloody drainage, looseness or tightness, or if dressing gets wet 5. Develop pain, swelling, warmth, or discoloration of your calf   Post Anesthesia Home Care Instructions  Activity: Get plenty of rest for the remainder of the day. A responsible individual must stay with you for 24 hours following the procedure.  For the next 24 hours, DO NOT: -Drive a car -Advertising copywriter -Drink alcoholic beverages -Take any medication unless instructed by your physician -Make any legal decisions or sign important papers.  Meals: Start with liquid foods such as gelatin or soup. Progress to regular foods as tolerated. Avoid greasy, spicy, heavy foods. If nausea and/or vomiting occur, drink only clear liquids until the nausea and/or vomiting subsides. Call your physician if vomiting continues.  Special Instructions/Symptoms: Your throat may feel dry or sore from the anesthesia or the breathing tube placed in your throat during surgery. If this causes discomfort, gargle with warm salt water. The discomfort should disappear within 24 hours.  If you had a scopolamine patch placed behind your ear for the management of post- operative nausea and/or vomiting:  1. The medication in the patch is effective for 72 hours, after which it should be removed.  Wrap patch in a tissue and discard in the trash. Wash hands thoroughly with soap and water. 2. You may remove the patch earlier than 72 hours if you experience unpleasant side effects  which may include dry mouth, dizziness or visual disturbances. 3. Avoid touching the patch. Wash your hands with soap and water after contact with the patch.    Regional Anesthesia Blocks  1. Numbness or the inability to move the "blocked" extremity may last from 3-48 hours after placement. The length of time depends on the medication injected  and your individual response to the medication. If the numbness is not going away after 48 hours, call your surgeon.  2. The extremity that is blocked will need to be protected until the numbness is gone and the  Strength has returned. Because you cannot feel it, you will need to take extra care to avoid injury. Because it may be weak, you may have difficulty moving it or using it. You may not know what position it is in without looking at it while the block is in effect.  3. For blocks in the legs and feet, returning to weight bearing and walking needs to be done carefully. You will need to wait until the numbness is entirely gone and the strength has returned. You should be able to move your leg and foot normally before you try and bear weight or walk. You will need someone to be with you when you first try to ensure you do not fall and possibly risk injury.  4. Bruising and tenderness at the needle site are common side effects and will resolve in a few days.  5. Persistent numbness or new problems with movement should be communicated to the surgeon or the [facility] [phone] [facility] [phone]).

## 2020-11-17 NOTE — Transfer of Care (Signed)
Immediate Anesthesia Transfer of Care Note  Patient: Jason Levy.  Procedure(s) Performed: OPEN TREATMENT RIGHT TALUS WITH REPAIR NONUNION, OPEN TREATMENT OF SYNDESMOSIS, ANKLE ARTHROTOMY (Right Ankle) OPEN REDUCTION INTERNAL FIXATION (ORIF) ANKLE FRACTURE (Right Ankle)  Patient Location: PACU  Anesthesia Type:General  And regional Level of Consciousness: awake, alert  and oriented  Airway & Oxygen Therapy: Patient Spontanous Breathing and Patient connected to face mask oxygen  Post-op Assessment: Report given to RN and Post -op Vital signs reviewed and stable  Post vital signs: Reviewed and stable  Last Vitals:  Vitals Value Taken Time  BP 123/63 11/17/20 1115  Temp    Pulse 86 11/17/20 1117  Resp 16 11/17/20 1117  SpO2 100 % 11/17/20 1117  Vitals shown include unvalidated device data.  Last Pain:  Vitals:   11/17/20 0734  TempSrc: Oral  PainSc: 0-No pain      Patients Stated Pain Goal: 4 (11/17/20 0734)  Complications: No complications documented.

## 2020-11-17 NOTE — Progress Notes (Signed)
Assisted Dr. Rob Fitzgerald with right, ultrasound guided, popliteal/saphenous block. Side rails up, monitors on throughout procedure. See vital signs in flow sheet. Tolerated Procedure well. 

## 2020-11-17 NOTE — Anesthesia Procedure Notes (Signed)
Anesthesia Regional Block: Adductor canal block   Pre-Anesthetic Checklist: ,, timeout performed, Correct Patient, Correct Site, Correct Laterality, Correct Procedure, Correct Position, site marked, Risks and benefits discussed,  Surgical consent,  Pre-op evaluation,  At surgeon's request and post-op pain management  Laterality: Right  Prep: chloraprep       Needles:  Injection technique: Single-shot  Needle Type: Echogenic Needle     Needle Length: 9cm  Needle Gauge: 21     Additional Needles:   Procedures:,,,, ultrasound used (permanent image in chart),,,,  Narrative:  Start time: 11/17/2020 8:24 AM End time: 11/17/2020 8:29 AM Injection made incrementally with aspirations every 5 mL.  Performed by: Personally  Anesthesiologist: Marcene Duos, MD

## 2020-11-17 NOTE — Anesthesia Preprocedure Evaluation (Signed)
Anesthesia Evaluation  Patient identified by MRN, date of birth, ID band Patient awake    Reviewed: Allergy & Precautions, NPO status , Patient's Chart, lab work & pertinent test results  Airway Mallampati: II  TM Distance: >3 FB Neck ROM: Full    Dental  (+) Dental Advisory Given   Pulmonary sleep apnea , Patient abstained from smoking., former smoker,    breath sounds clear to auscultation       Cardiovascular hypertension, Pt. on medications  Rhythm:Regular Rate:Normal     Neuro/Psych negative neurological ROS     GI/Hepatic negative GI ROS, Neg liver ROS,   Endo/Other  diabetes, Type 2, Insulin DependentMorbid obesity  Renal/GU negative Renal ROS     Musculoskeletal   Abdominal   Peds  Hematology negative hematology ROS (+)   Anesthesia Other Findings   Reproductive/Obstetrics                             Anesthesia Physical Anesthesia Plan  ASA: III  Anesthesia Plan: General   Post-op Pain Management:  Regional for Post-op pain   Induction: Intravenous  PONV Risk Score and Plan: 2 and Dexamethasone, Ondansetron and Treatment may vary due to age or medical condition  Airway Management Planned: LMA  Additional Equipment: None  Intra-op Plan:   Post-operative Plan: Extubation in OR  Informed Consent: I have reviewed the patients History and Physical, chart, labs and discussed the procedure including the risks, benefits and alternatives for the proposed anesthesia with the patient or authorized representative who has indicated his/her understanding and acceptance.     Dental advisory given  Plan Discussed with: CRNA  Anesthesia Plan Comments:         Anesthesia Quick Evaluation

## 2020-11-17 NOTE — H&P (Signed)
PREOPERATIVE H&P  Chief Complaint: Right ankle pain  HPI: Jason Levy. is a 45 y.o. 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  Patient underwent open treatment of a ankle fracture dislocation by Dr. Renaye Rakers in October 2021.  He had persistent pain and CT evidence of large medial talar fragment nonunion with syndesmotic widening on standing x-rays.  He had pain in these areas.  He failed conservative treatment in the form of boot immobilization and rest.  He was indicated for surgery.  Of note, he has a baseline foot drop from prior injury.  He does not wear a brace for this but does wear over the ankle boots. Symptoms are rated as moderate to severe, and have been worsening.  This is significantly impairing activities of daily living.  He has elected for surgical management.   Past Medical History:  Diagnosis Date  . Anxiety   . Depression   . Diabetes mellitus without complication (HCC)   . History of COVID-19 09/22/2020  . Hypertension   . Sleep apnea    Past Surgical History:  Procedure Laterality Date  . APPENDECTOMY    . BACK SURGERY    . HERNIA REPAIR    . ORIF ANKLE FRACTURE Right 06/30/2020   Procedure: OPEN REDUCTION INTERNAL FIXATION (ORIF) ANKLE FRACTURE;  Surgeon: Sheral Apley, MD;  Location: [facility];  Service: Orthopedics;  Laterality: Right;   Social History   Socioeconomic History  . Marital status: Single    Spouse name: Not on file  . Number of children: Not on file  . Years of education: Not on file  . Highest education level: Not on file  Occupational History  . Not on file  Tobacco Use  . Smoking status: Former Smoker    Quit date: 06/12/2020    Years since quitting: 0.4  . Smokeless tobacco: Never Used  Vaping Use  . Vaping Use: Every day  . Substances: Flavoring  Substance and Sexual Activity  . Alcohol use: Not Currently     Alcohol/week: 0.0 standard drinks  . Drug use: Yes    Types: Marijuana  . Sexual activity: Not on file  Other Topics Concern  . Not on file  Social History Narrative  . Not on file   Social Determinants of Health   Financial Resource Strain: Not on file  Food Insecurity: Not on file  Transportation Needs: Not on file  Physical Activity: Not on file  Stress: Not on file  Social Connections: Not on file   Family History  Family history unknown: Yes   Allergies  Allergen Reactions  . Amoxil [Amoxicillin] Hives and Itching  . Citalopram Other (See Comments)    Depression    . Nabumetone Other (See Comments)    Bleeding (intolerance)  . Penicillins Hives and Nausea And Vomiting    Did it involve swelling of the face/tongue/throat, SOB, or low BP? N Did it involve sudden or severe rash/hives, skin peeling, or any reaction on the inside of your mouth or nose? Y Did you need to seek medical attention at a hospital or doctor's office? Y, was in hospital When did it last happen?childhood If all above answers are "NO", may proceed with cephalosporin use.     . Cefdinir Itching  . Semaglutide Nausea And Vomiting   Prior to Admission medications   Medication Sig Start Date End Date Taking? Authorizing Provider  albuterol (  VENTOLIN HFA) 108 (90 Base) MCG/ACT inhaler Inhale 2 puffs into the lungs every 6 (six) hours as needed for wheezing or shortness of breath.  10/17/18  Yes [provider]  buPROPion (WELLBUTRIN XL) 150 MG 24 hr tablet Take 450 mg by mouth daily. 02/09/20  Yes [provider]  DULoxetine (CYMBALTA) 60 MG capsule Take 60 mg by mouth daily. 07/18/19  Yes [provider]  ferrous sulfate 325 (65 FE) MG tablet Take 325 mg by mouth daily with breakfast.  11/09/16  Yes [provider]  glimepiride (AMARYL) 2 MG tablet Take 2 mg by mouth daily. 04/16/20  Yes [provider]  Insulin Glargine, 1 Unit Dial, (TOUJEO SOLOSTAR) 300  UNIT/ML SOPN Inject 30 Units into the skin at bedtime.  06/26/19  Yes [provider]  ketoconazole (NIZORAL) 2 % shampoo Apply 1 application topically every other day.  05/16/20  Yes [provider]  lisinopril (ZESTRIL) 20 MG tablet Take 20 mg by mouth daily. 02/24/20  Yes [provider]  metFORMIN (GLUCOPHAGE-XR) 500 MG 24 hr tablet Take 1,000 mg by mouth 2 (two) times daily. 02/20/20  Yes [provider]  rosuvastatin (CRESTOR) 5 MG tablet Take 5 mg by mouth daily. 08/22/19  Yes [provider]  sertraline (ZOLOFT) 50 MG tablet Take 50 mg by mouth daily.   Yes [provider]  testosterone cypionate (DEPOTESTOSTERONE CYPIONATE) 200 MG/ML injection Inject 200 mg into the muscle every 14 (fourteen) days.  08/18/16  Yes [provider]  traZODone (DESYREL) 50 MG tablet Take 100 mg by mouth at bedtime.  03/15/20  Yes [provider]     Positive ROS: All other systems have been reviewed and were otherwise negative with the exception of those mentioned in the HPI and as above.  Physical Exam:  Vitals:   11/17/20 0734  BP: 131/79  Pulse: 75  Resp: 16  Temp: 98.3 F (36.8 C)  SpO2: 100%   General: Alert, no acute distress Cardiovascular: No pedal edema Respiratory: No cyanosis, no use of accessory musculature GI: No organomegaly, abdomen is soft and non-tender Skin: No lesions in the area of chief complaint Neurologic: Sensation intact distally Psychiatric: Patient is competent for consent with normal mood and affect Lymphatic: No axillary or cervical lymphadenopathy  MUSCULOSKELETAL: Right foot demonstrates evidence of plantar flexion.  Foot drop with absent ankle dorsiflexion.  Tenderness palpation along the posterior medial aspect of the talus in the area of the posterior tibial tendon.  Also tenderness along the anterior aspect of the ankle at the level of the syndesmosis.  He does have palpable hardware.  Ankle is  tender to palpation anteriorly as well.  He has instability noted on anterior drawer testing of the ankle joint.  Distally sensation grossly intact.  Foot is warm and well-perfused.  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 Retained orthopedic hardware Syndesmosis disruption with instability   Plan: Plan for open treatment of his talus fracture with repair of nonunion.  This will be done through a medial approach.  Then laterally we will have to address the syndesmotic disruption.  Hardware will be removed and fixation across the syndesmosis placed.  He understands that this will not address his foot drop but will hopefully address the pain and instability.  We discussed the risks, benefits and alternatives of surgery which include but are not limited to wound healing complications, infection, nonunion, malunion, need for further surgery, damage to surrounding structures and continued pain.  They understand there is no guarantees to an acceptable outcome.  After weighing these risks they opted to proceed with surgery.     Terance Hart, MD    11/17/2020 8:23 AM

## 2020-11-17 NOTE — Anesthesia Postprocedure Evaluation (Signed)
Anesthesia Post Note  Patient: Jason Levy.  Procedure(s) Performed: OPEN TREATMENT RIGHT TALUS WITH REPAIR NONUNION, OPEN TREATMENT OF SYNDESMOSIS, ANKLE ARTHROTOMY (Right Ankle) OPEN REDUCTION INTERNAL FIXATION (ORIF) ANKLE FRACTURE (Right Ankle)     Patient location during evaluation: PACU Anesthesia Type: General and Regional Level of consciousness: awake and alert Pain management: pain level controlled Vital Signs Assessment: post-procedure vital signs reviewed and stable Respiratory status: spontaneous breathing, nonlabored ventilation, respiratory function stable and patient connected to nasal cannula oxygen Cardiovascular status: blood pressure returned to baseline and stable Postop Assessment: no apparent nausea or vomiting Anesthetic complications: no   No complications documented.  Last Vitals:  Vitals:   11/17/20 1145 11/17/20 1215  BP: 138/80 113/69  Pulse: 80 82  Resp: 10 18  Temp:  37.1 C  SpO2: 97% 96%    Last Pain:  Vitals:   11/17/20 1215  TempSrc:   PainSc: 0-No pain                 Maryjo Ragon L Adel Neyer

## 2020-11-17 NOTE — Anesthesia Procedure Notes (Signed)
Procedure Name: LMA Insertion Performed by: Camerin Jimenez S, CRNA Pre-anesthesia Checklist: Patient identified, Emergency Drugs available, Suction available and Patient being monitored Patient Re-evaluated:Patient Re-evaluated prior to induction Oxygen Delivery Method: Circle System Utilized Preoxygenation: Pre-oxygenation with 100% oxygen Induction Type: IV induction Ventilation: Mask ventilation without difficulty LMA: LMA inserted LMA Size: 5.0 Number of attempts: 1 Airway Equipment and Method: Bite block Placement Confirmation: positive ETCO2 Tube secured with: Tape Dental Injury: Teeth and Oropharynx as per pre-operative assessment        

## 2020-11-19 ENCOUNTER — Encounter (HOSPITAL_BASED_OUTPATIENT_CLINIC_OR_DEPARTMENT_OTHER): Payer: Self-pay | Admitting: Orthopaedic Surgery

## 2020-11-27 NOTE — Op Note (Signed)
Jason Levy. male 45 y.o. 11/27/2020  PreOperative Diagnosis: Right talus fracture Right talus fracture nonunion Failure of deep orthopedic hardware Right disruption of syndesmosis Right ankle intra-articular loose bodies   PostOperative Diagnosis: Same  PROCEDURE: Open reduction internal fixation of right talus fracture Repair of talus nonunion Removal of deep orthopedic hardware Open treatment of syndesmosis Ankle arthrotomy with synovectomy and removal of loose bodies   SURGEON: Dub Mikes, MD  ASSISTANT: None  ANESTHESIA: General LMA anesthesia with peripheral nerve blockade  FINDINGS: See below  IMPLANTS: Arthrex headless compression screw One third tubular plate with small fragment screws  INDICATIONS:44 y.o. male sustained an ankle fracture dislocation back approximately 6 months ago.  He underwent open treatment of his fibula fracture by Dr. Renaye Rakers.  Subsequently he had continued instability and discomfort medially.  Due to his pain a CT scan was ordered and demonstrated nonunion of his talus fracture.  He also had gross instability of the syndesmosis noted on weightbearing x-rays of the ankle.  Given these findings we had a lengthy discussion about continued conservative treatment versus surgical intervention.  He does have a foot drop at baseline due to prior injury and was requesting surgery for improvement and stability of his ankle as well as improvement in pain.  Given this we proceeded with surgery.   Patient understood the risks, benefits and alternatives to surgery which include but are not limited to wound healing complications, infection, nonunion, malunion, need for further surgery as well as damage to surrounding structures. They also understood the potential for continued pain in that there were no guarantees of acceptable outcome After weighing these risks the patient opted to proceed with surgery.  PROCEDURE: Patient was identified in  the preoperative holding area.  The right leg was marked by myself.  Consent was signed by myself and the patient.  Block was performed by anesthesia in the preoperative holding area.  Patient was taken to the operative suite and placed supine on the operative table.  General LMA anesthesia was induced without difficulty. Bump was placed under the operative hip and bone foam was used.  All bony prominences were well padded.  Tourniquet was placed on the operative thigh.  Preoperative antibiotics were given. The extremity was prepped and draped in the usual sterile fashion and surgical timeout was performed.  The limb was elevated and the tourniquet was inflated to 250 mmHg.  We began by making a longitudinal incision overlying the posterior border of the medial malleolus just posterior to the course of the posterior tibial tendon.  This was taken distally along this area.  Was taken sharply down through skin and subcutaneous tissue.  Blunt dissection was used to mobilize skin flaps.  The sheath overlying the posterior tibial tendon was identified.  This was incised in line with the tendon.  The tendon was identified.  Then the interval between the posterior tibial tendon, neurovascular bundle and flexor digitorum longus tendons was identified.  The neurovascular bundle was mobilized and protected through the entirety of the case.  Then this interval was used to gain access to the medial aspect of the talus.  Incision was made deep within the deep portion of the tendon sheath structure.  This was taken along the line of the tendinous structures.  2 through direct palpation were able to identify the fracture site of the medial talus.  Soft tissue around this area was mobilized.  The fracture fragment was identified.  There is significant amount of fracture  callus in this area but no evidence of bony healing.  The fracture fragment was mobile.  Using a rondure the fracture callus material was removed.  The fracture  fragment was mobilized. Then the base of the fracture site that had healed over with fibrous fracture callus was identified.  This area was then denuded of fracture callus back to healthy bleeding bony surfaces.  The nonunion site was prepared for internal fixation.  The bony surfaces were roughened up using a curette.  The fibrous tissue was fully removed.  The fracture fragment was fully mobilized.    We were then able to directly reduce the fractured medial talar fragment back into its appropriate position for healing.  Good compression across this area was attained using a pointed reduction forcep.  Then a K wire was used to provisionally fixate the fracture fragment.  Then a single headless compression screw was placed across the fracture fragment for compression and fixation of the fracture site.  Fluoroscopy confirmed appropriate position of the screw.  There was stable fixation through direct palpation and with attempted movement with freer elevator.  We then reinspected the neurovascular bundle and it was found to be intact.  The wound was then irrigated copiously with normal saline.  The tendon sheath was closed using 3-0 Monocryl suture.  The wound was then closed using 3-0 Monocryl and 3-0 nylon suture.  We then turned our attention to the lateral ankle.  An incision was made overlying the previously made incision overlying the lateral malleolus.  This was taken sharply down through skin and subcutaneous tissue.  Blunt dissection was used to identify any branches of the superficial peroneal nerve which were not identified.  Then the incision was carried down to the plate that was placed previously on the fibula.  This was a one third tubular plate.  The plate was fully identified and the tissue removed from the plate.  Then using an appropriate screwdriver the screws within the plate were removed sequentially without difficulty.  The plate was then elevated from the bone using a Freer and removed.   Rondure was used to smooth out the bony surfaces in this area and fibrous tissue that had grown up around the plate.  Then a separate deep incision was made anterior to the fibula to gain access to the syndesmosis.  The syndesmosis was identified.  There is fibrous tissue and gross instability through the syndesmosis.  Using a rondure the syndesmosis was cleaned of its fibrous scarring and any interposed tissue.  The syndesmosis was then further inspected.  The gross instability was notable with lateral translation as well as anterior to posterior translation.  A one third tubular plate was then placed along the lateral aspect of the fibula and bicortical screws were placed proximally and distally within this plate.  Then using a Weber clamp and direct visualization the syndesmosis was reduced and held provisionally with a Weber clamp.  Then 2 screws were placed across the syndesmosis for fixation.  The Weber clamp was then removed and the syndesmosis was stressed under fluoroscopy and found to be stable.  Then a separate deep incision was made distally along the aspect of the distal anterior fibula.  The joint capsule for the ankle joint was identified.  An incision was made within the joint capsule of the ankle in the anterolateral joint.  An arthrotomy was created.  Then the joint was inspected.  There was loose bodies within the lateral ankle gutter and anterolateral ankle.  Using a rondure these loose fragments were removed.  The joint was irrigated copiously.  About the visualized portion of the joint there was no obvious talar dome osteochondral lesion notable.  The joint was further irrigated.  The capsular tissue was then closed using a 3-0 Monocryl suture.  The remaining wound was irrigated copiously with normal saline and closed in a layered fashion using 3-0 Monocryl and staples.  Soft dressing was then placed.  He was placed in a nonweightbearing short leg splint.  He was awakened from anesthesia  and taken recovery in stable condition.  There were no complications.  Patient tolerated the procedure well.   POST OPERATIVE INSTRUCTIONS: Nonweightbearing to operative extremity Full-strength 325 mg aspirin for DVT prophylaxis Keep splint dry and limb elevated He will follow-up in 2 weeks for splint removal, suture removal if appropriate and placement of a cast.  TOURNIQUET TIME: Less than 2 hours  BLOOD LOSS:  Minimal         DRAINS: none         SPECIMEN: none       COMPLICATIONS:  * No complications entered in OR log *         Disposition: PACU - hemodynamically stable.         Condition: stable

## 2022-01-10 IMAGING — CT CT ANKLE*R* W/O CM
1 series · 12 of 14 positions shown, 15 images · non-contrast
Comparison: None.

CLINICAL DATA: Status post trauma.

EXAM:
CT OF THE RIGHT ANKLE WITHOUT CONTRAST
TECHNIQUE: Multidetector CT imaging of the right ankle was performed according
to the standard protocol. Multiplanar CT image reconstructions were
also generated.

[Series 6: axial st · axial · 0.34mm/px · z∈[-1266,-1112]mm · 12 of 92 slices shown, 15 images]
[im 8/92  soft-tissue]
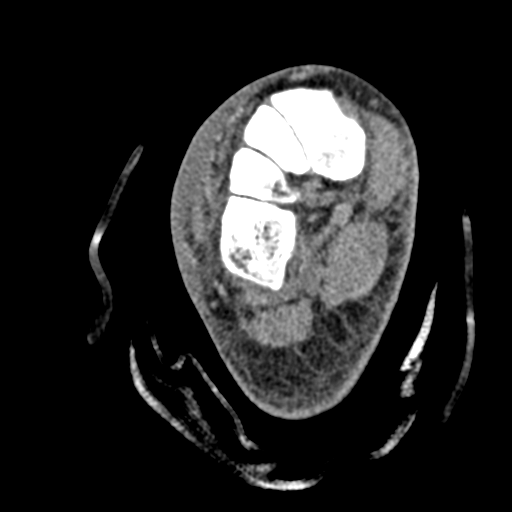
[im 8/92  bone]
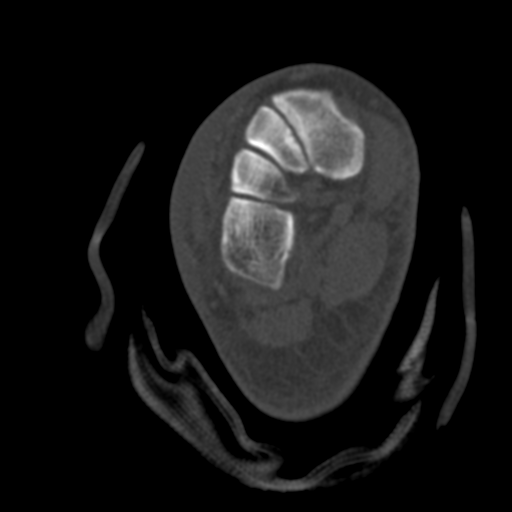
[im 15/92  bone]
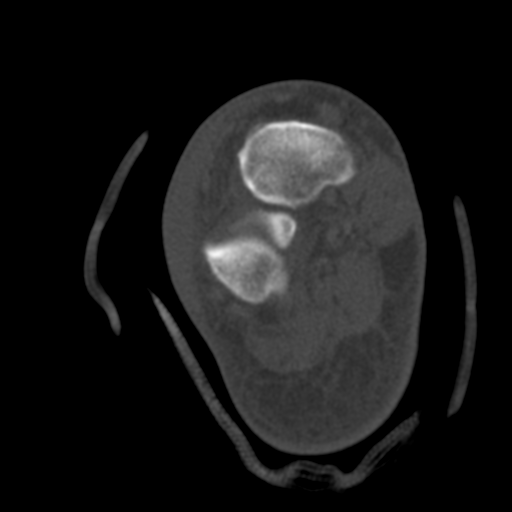
[im 22/92  bone]
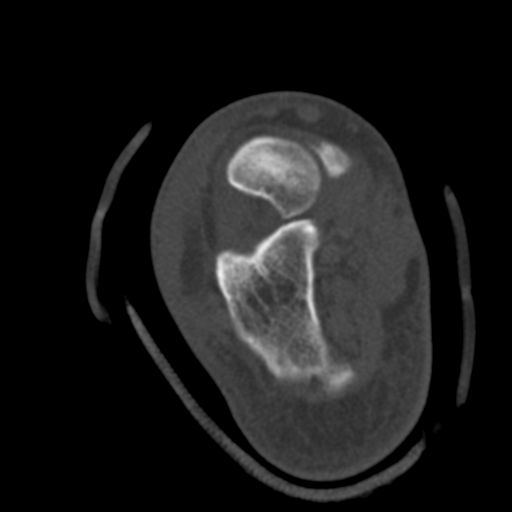
[im 29/92  bone]
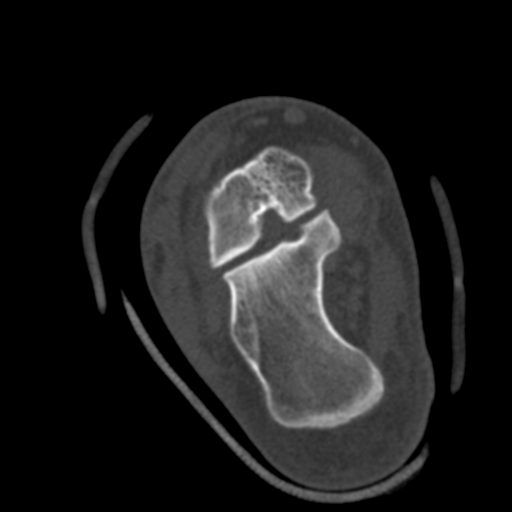
[im 36/92  soft-tissue]
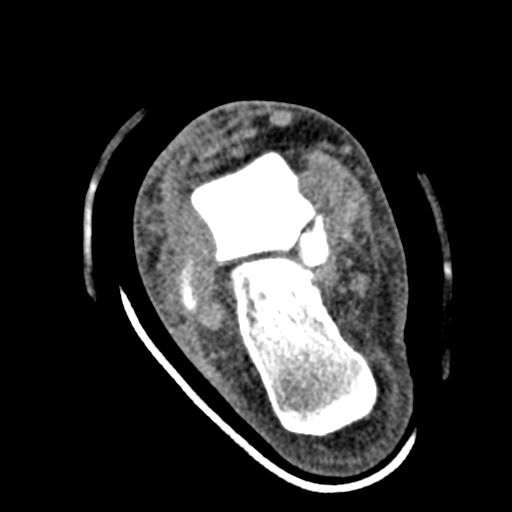
[im 36/92  bone]
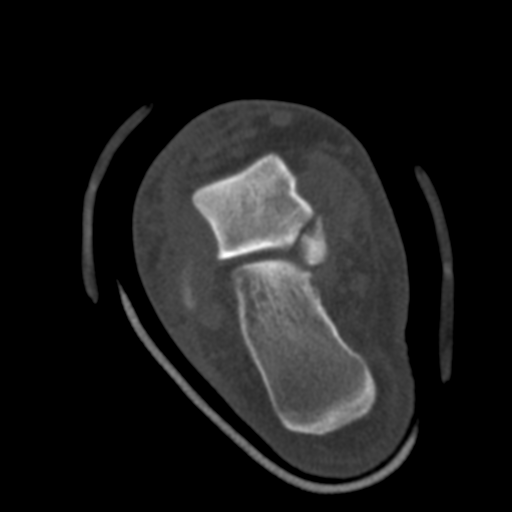
[im 43/92  bone]
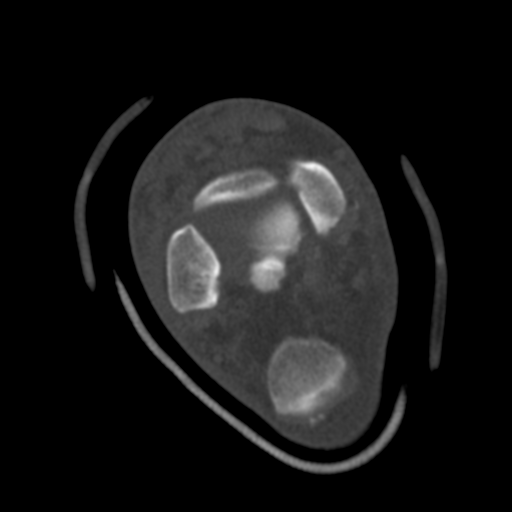
[im 50/92  bone]
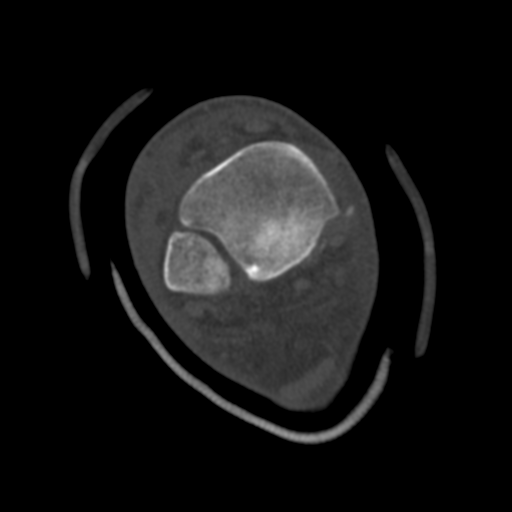
[im 57/92  bone]
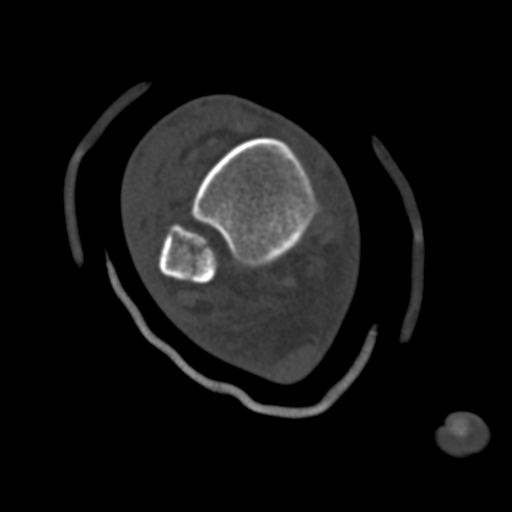
[im 64/92  soft-tissue]
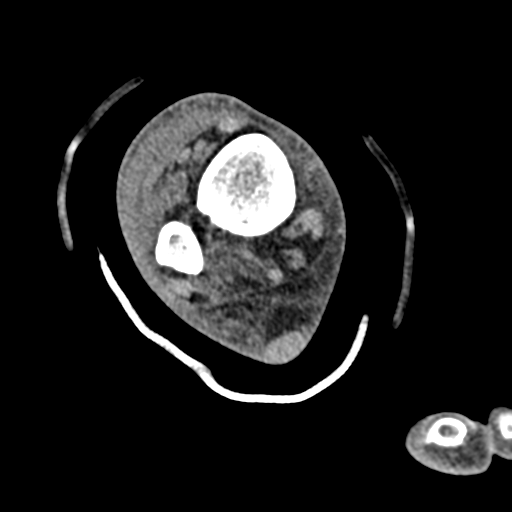
[im 64/92  bone]
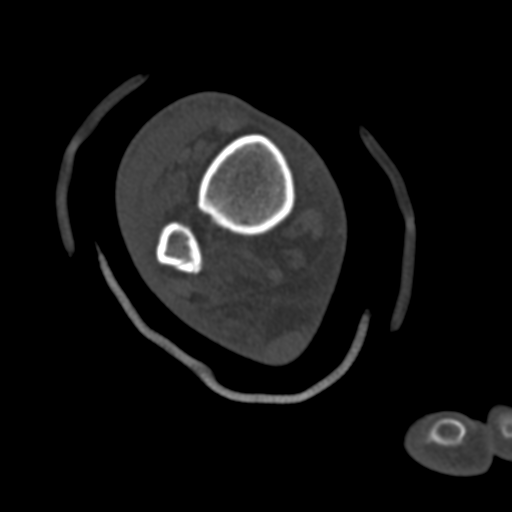
[im 71/92  bone]
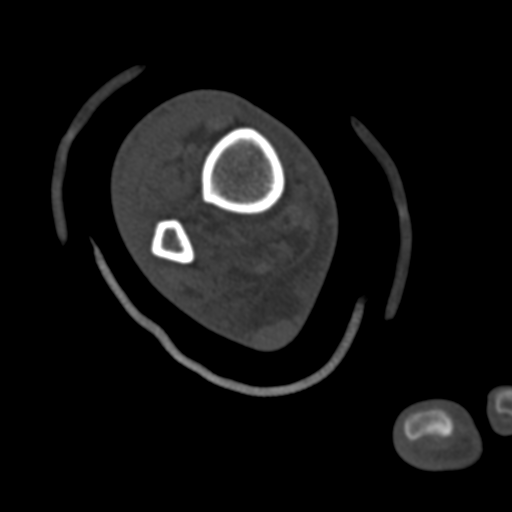
[im 78/92  bone]
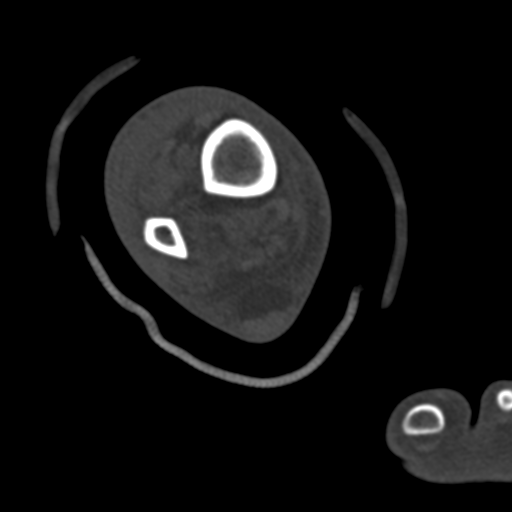
[im 85/92  bone]
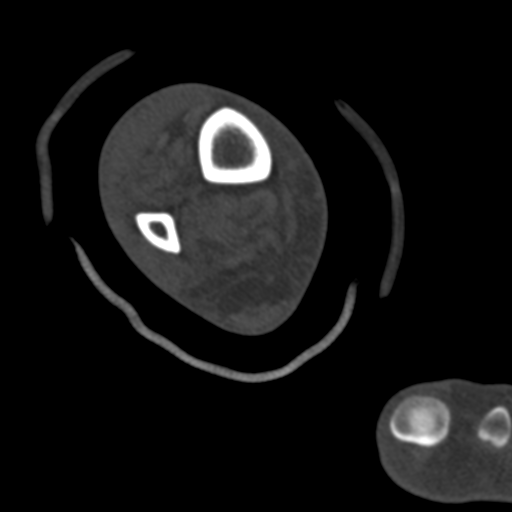

[12 of 14 positions shown; findings below may reference images not displayed]

FINDINGS: Bones/Joint/Cartilage

An acute, comminuted, nondisplaced fracture deformity is seen
involving the distal right fibula. This is just above the right
lateral malleolus.

A mildly displaced acute fracture of the proximal right talus is
seen. This extends to involve the posterior aspect of the talar
dome.

A very thin linear cortical density is seen adjacent to the
posteromedial aspect of the right medial malleolus. This measures
approximately 1.5 cm in length and is best seen on coronal
reformatted images 36 through 38/CT series number 9 and sagittal
reformatted images 39 through 42/CT series number 10).

There is mild posterior subluxation of the left ankle.

Ligaments

Suboptimally assessed by CT.

Muscles and Tendons

Muscles and tendons are intact.

Soft tissues

Mild to moderate severity soft tissue swelling is seen along the
anterolateral aspect of the right ankle. This extends inferiorly
along the anterolateral aspect of the right foot.
IMPRESSION: 1. Acute fracture of the distal right fibula and proximal right
talus.
2. Curvilinear avulsion fracture along the posteromedial aspect of
the right medial malleolus. MRI correlation is recommended.
3. Mild posterior subluxation of the left ankle.

## 2022-01-10 IMAGING — CR DG ANKLE COMPLETE 3+V*R*
3 series · 3 of 3 positions shown · non-contrast
Comparison: None.

CLINICAL DATA: Right ankle pain, deformity

EXAM:
RIGHT ANKLE - COMPLETE 3+ VIEW

[x ankle ap right]
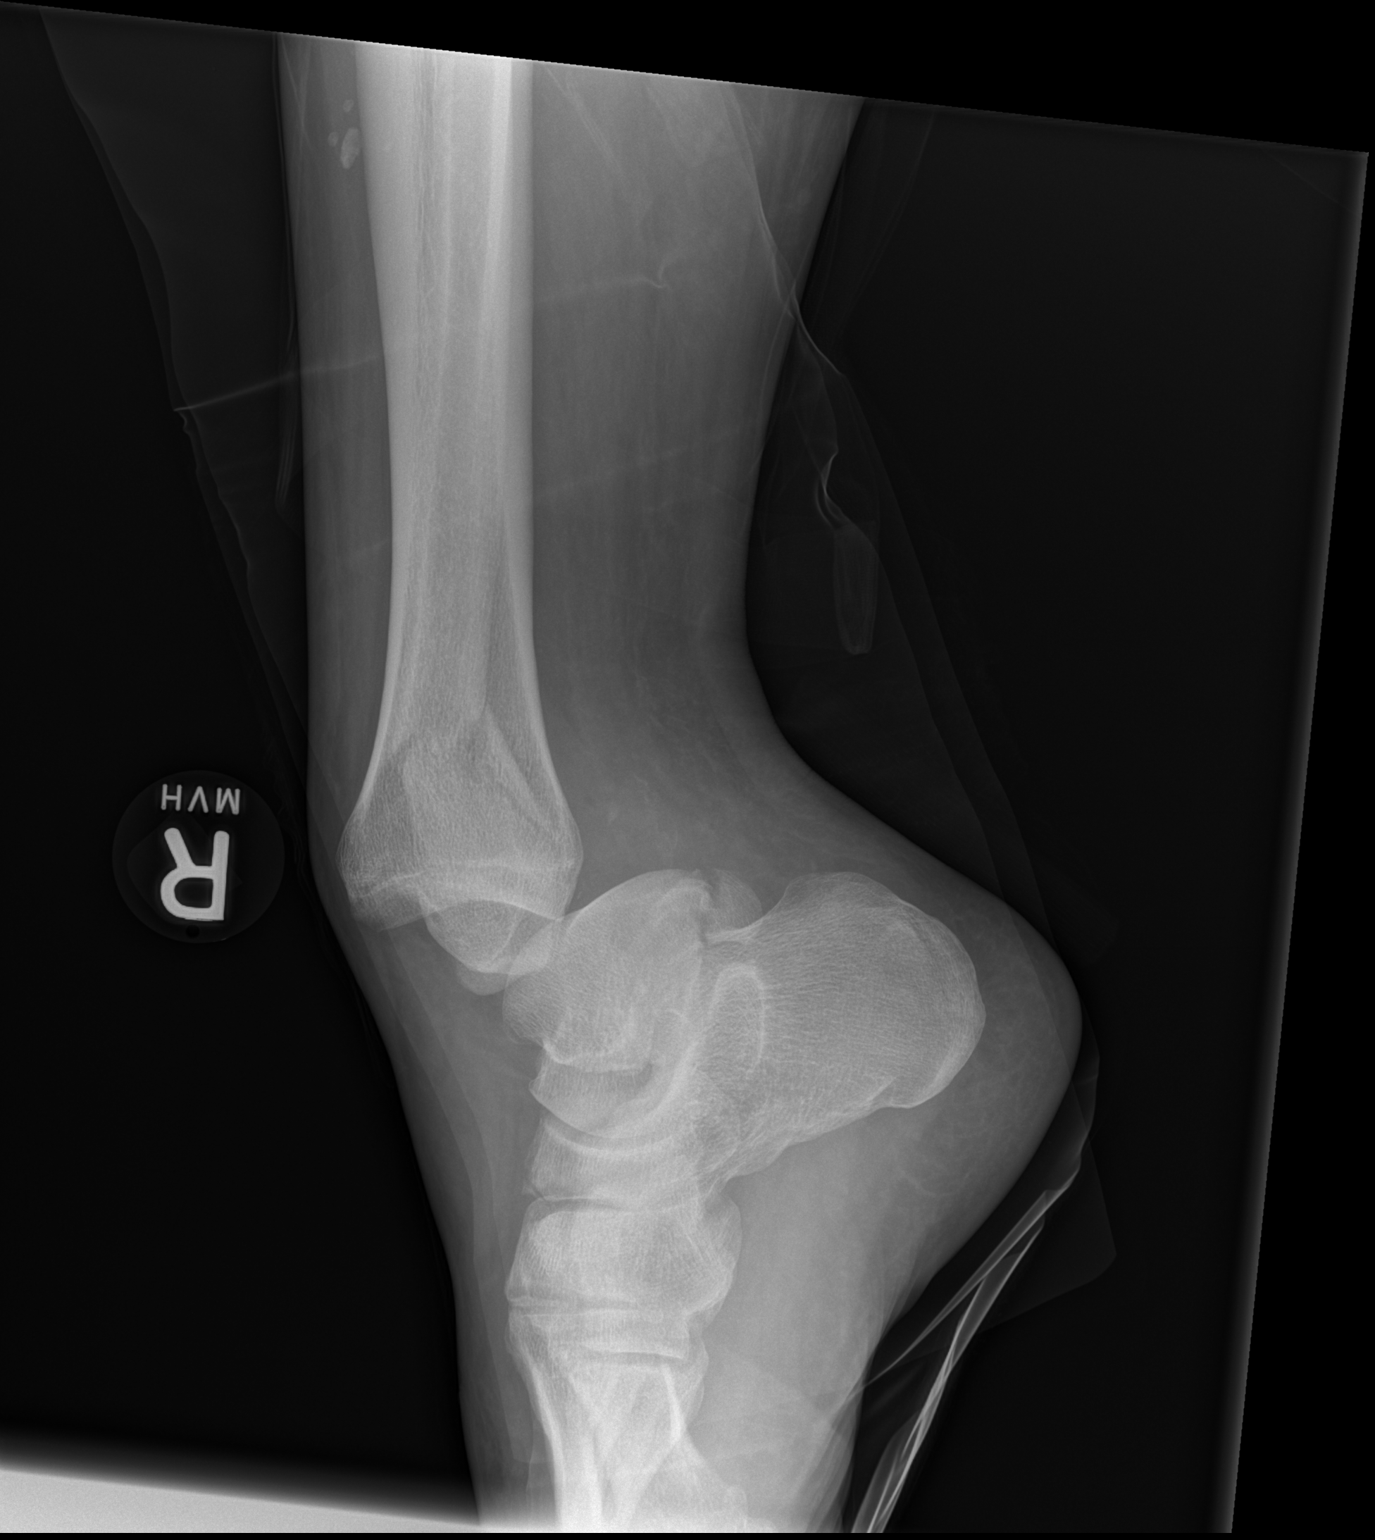

[x ankle obl right]
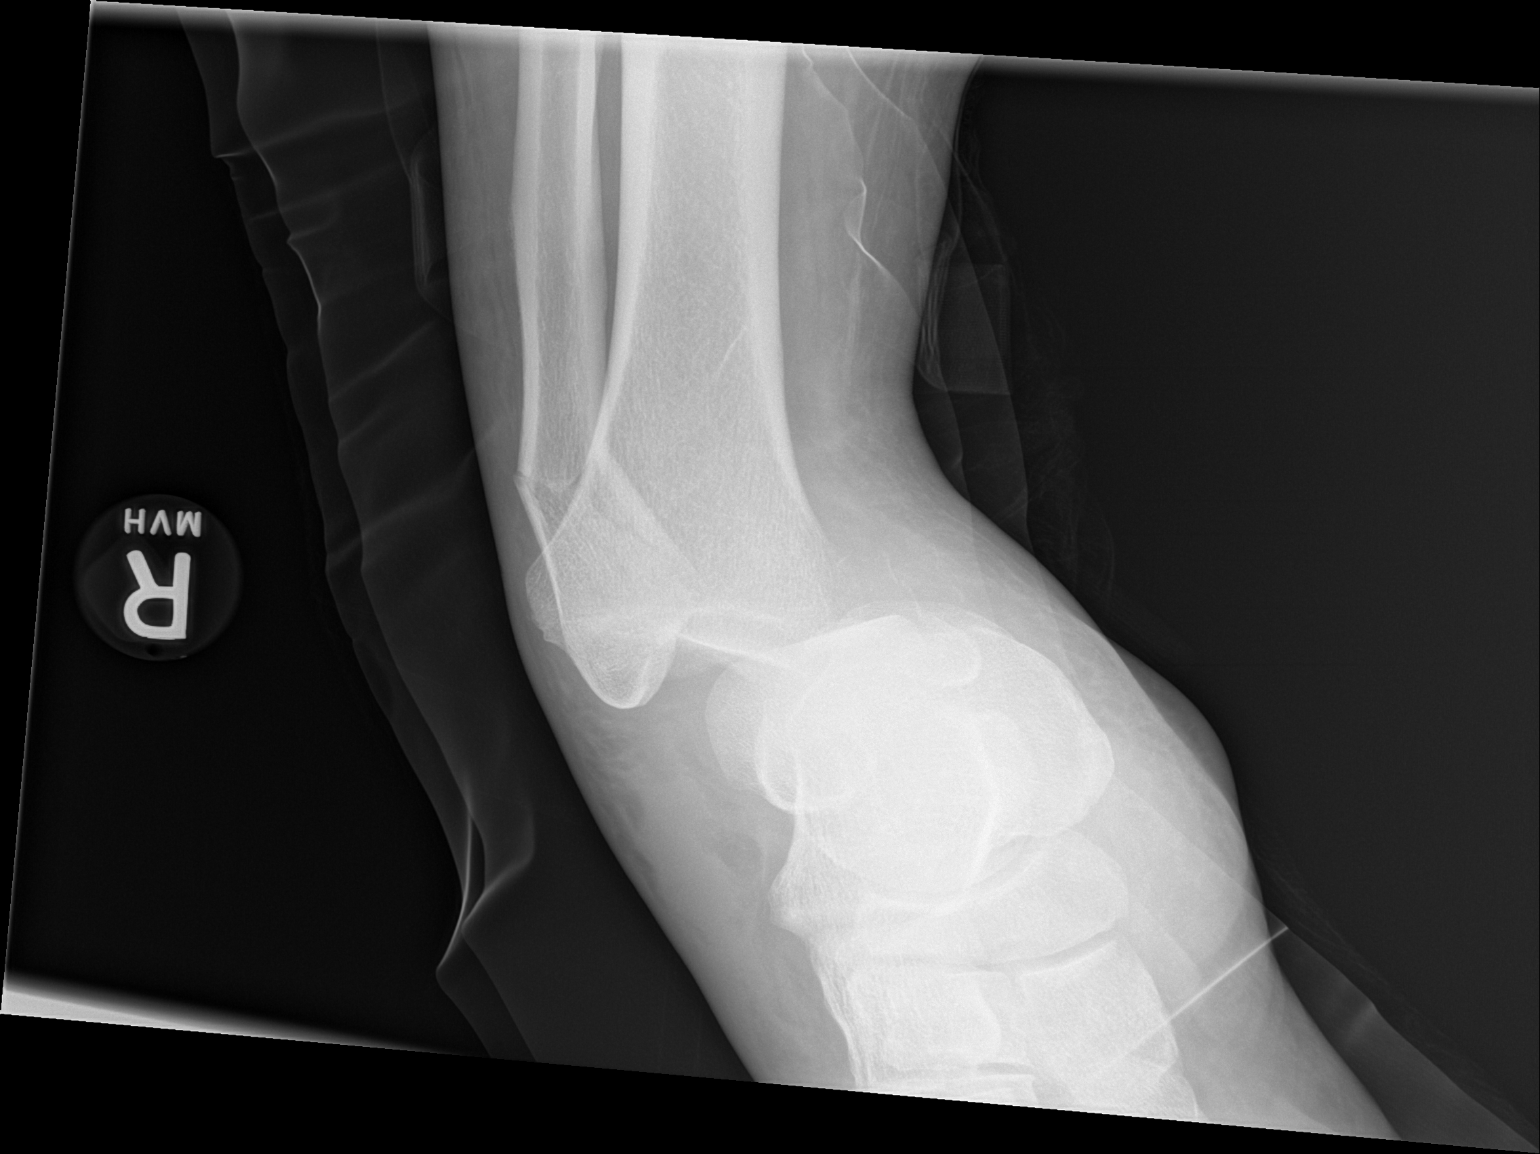

[x ankle lat right]
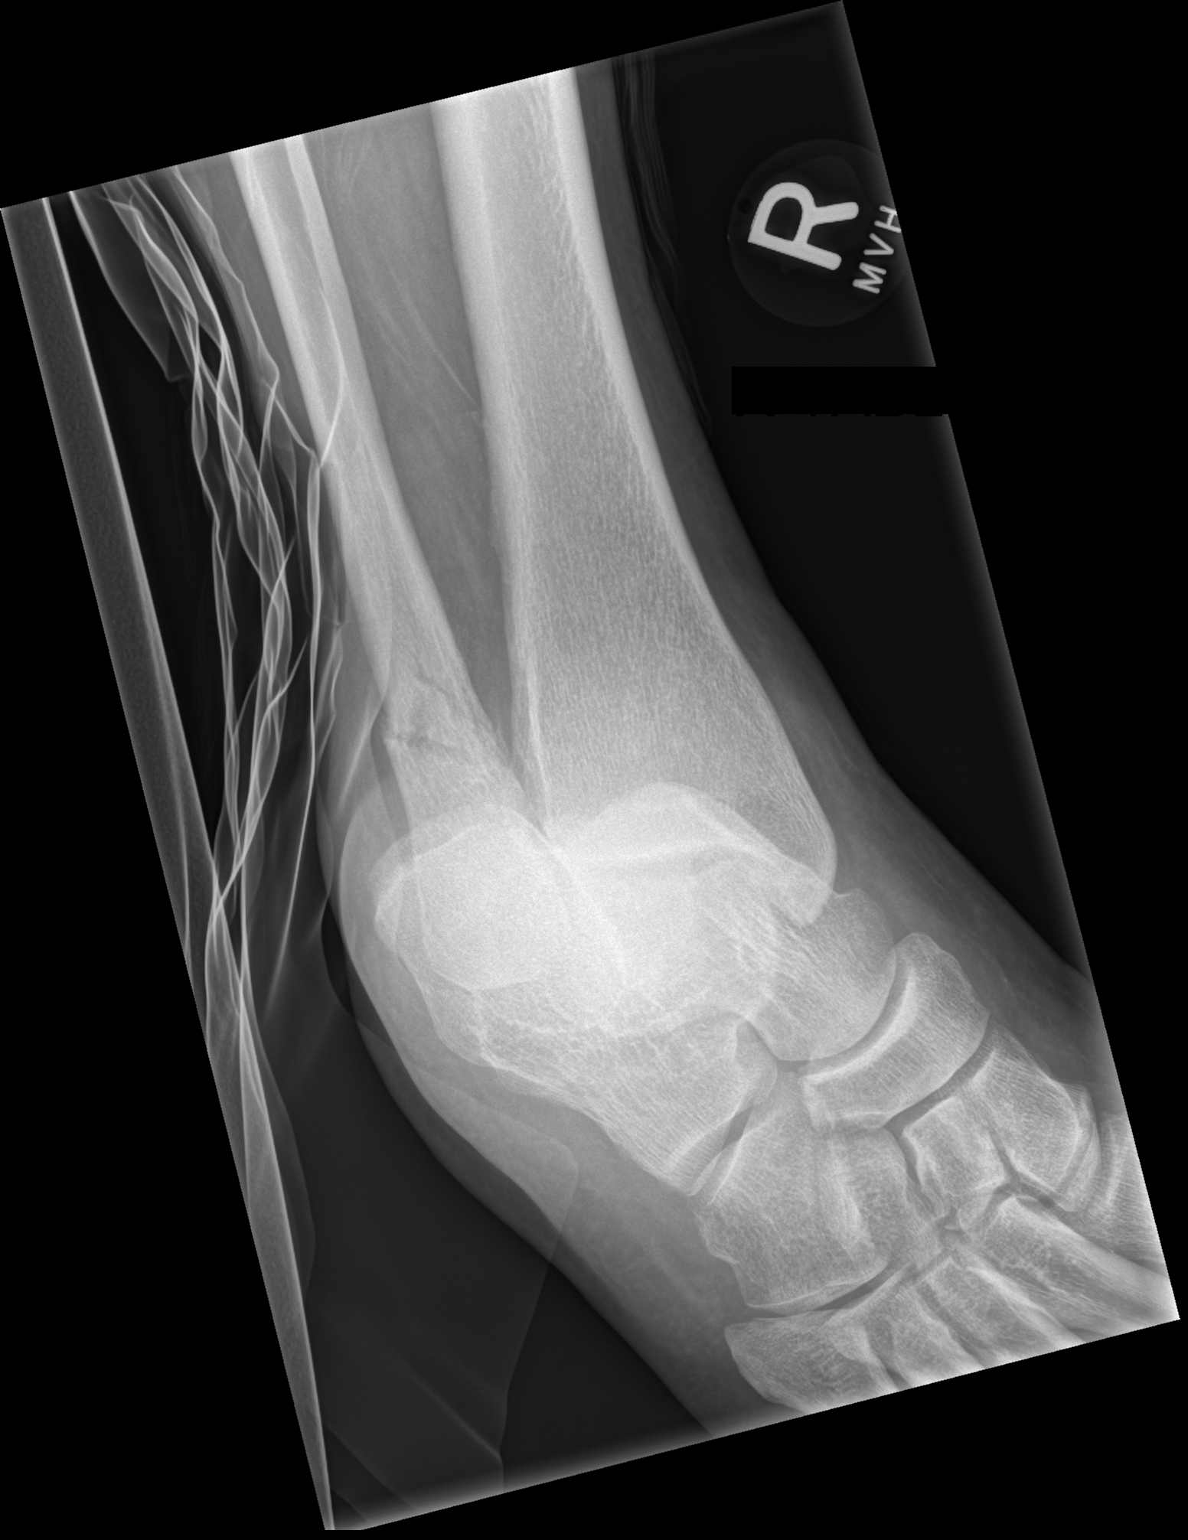

[3 of 3 positions shown; findings below may reference images not displayed]

FINDINGS: Acute fracture of the distal fibular metaphysis with mild anterior
apex angulation. Minimally displaced fracture involving the
posterior process of talus. Suspect additional cortical fracture of
the talar body not well characterized given the patient positioning.
There is posterior and medial dislocation of the talus relative to
the tibial plafond. Steffen Pruneda alignment of the midfoot. Diffuse soft
tissue swelling.
IMPRESSION: 1. Right ankle dislocation.
2. Mildly angulated fracture of the distal fibular metaphysis.
3. Minimally displaced fracture of the posterior process of talus.
4. Additional small cortical avulsion type fracture of the talar
body.

## 2023-08-25 ENCOUNTER — Other Ambulatory Visit: Payer: Self-pay | Admitting: Orthopaedic Surgery

## 2023-08-25 DIAGNOSIS — M50223 Other cervical disc displacement at C6-C7 level: Secondary | ICD-10-CM

## 2023-09-07 NOTE — Discharge Instructions (Signed)

## 2023-09-08 ENCOUNTER — Ambulatory Visit
Admission: RE | Admit: 2023-09-08 | Discharge: 2023-09-08 | Disposition: A | Discharge status: Home / Self Care | Payer: Commercial Managed Care - PPO | Source: Ambulatory Visit | Attending: Orthopaedic Surgery | Admitting: Orthopaedic Surgery

## 2023-09-08 DIAGNOSIS — M50223 Other cervical disc displacement at C6-C7 level: Secondary | ICD-10-CM

## 2023-09-08 MED ORDER — IOPAMIDOL (ISOVUE-M 300) INJECTION 61%
10.0000 mL | Freq: Once | INTRAMUSCULAR | Status: AC | PRN
  Administered 2023-09-08: 10 mL via INTRATHECAL
